# Patient Record
Sex: Male | Born: 1970 | Hispanic: No | Marital: Single | Smoking: Never smoker
Health system: Southern US, Community
[De-identification: ages and names within clinical notes are randomized; demographics above are authoritative.]

## PROBLEM LIST (undated history)

## (undated) DIAGNOSIS — R309 Painful micturition, unspecified: Secondary | ICD-10-CM

## (undated) DIAGNOSIS — N1330 Unspecified hydronephrosis: Secondary | ICD-10-CM

## (undated) DIAGNOSIS — A159 Respiratory tuberculosis unspecified: Secondary | ICD-10-CM

## (undated) HISTORY — PX: NO PAST SURGERIES: SHX2092

---

## 2000-11-14 ENCOUNTER — Emergency Department (HOSPITAL_COMMUNITY): Admission: EM | Admit: 2000-11-14 | Discharge: 2000-11-14 | Payer: Self-pay

## 2012-05-20 NOTE — Progress Notes (Signed)
Unable to reach pt for pre-op phone call, no working numbers in the system.

## 2012-05-21 ENCOUNTER — Ambulatory Visit (HOSPITAL_COMMUNITY)
Admission: RE | Admit: 2012-05-21 | Discharge: 2012-05-21 | Disposition: A | Discharge status: Home / Self Care | Payer: Self-pay | Source: Ambulatory Visit | Attending: Orthopedic Surgery | Admitting: Orthopedic Surgery

## 2012-05-21 ENCOUNTER — Encounter (HOSPITAL_COMMUNITY): Payer: Self-pay | Admitting: Anesthesiology

## 2012-05-21 ENCOUNTER — Encounter (HOSPITAL_COMMUNITY)
Admission: RE | Disposition: A | Discharge status: Home / Self Care | Payer: Self-pay | Source: Ambulatory Visit | Attending: Orthopedic Surgery

## 2012-05-21 ENCOUNTER — Ambulatory Visit (HOSPITAL_COMMUNITY): Payer: Self-pay

## 2012-05-21 ENCOUNTER — Encounter (HOSPITAL_COMMUNITY): Payer: Self-pay | Admitting: *Deleted

## 2012-05-21 ENCOUNTER — Ambulatory Visit (HOSPITAL_COMMUNITY): Payer: Self-pay | Admitting: Anesthesiology

## 2012-05-21 DIAGNOSIS — W268XXA Contact with other sharp object(s), not elsewhere classified, initial encounter: Secondary | ICD-10-CM | POA: Insufficient documentation

## 2012-05-21 DIAGNOSIS — S51811A Laceration without foreign body of right forearm, initial encounter: Secondary | ICD-10-CM

## 2012-05-21 DIAGNOSIS — S51809A Unspecified open wound of unspecified forearm, initial encounter: Secondary | ICD-10-CM | POA: Insufficient documentation

## 2012-05-21 HISTORY — PX: I & D EXTREMITY: SHX5045

## 2012-05-21 HISTORY — DX: Respiratory tuberculosis unspecified: A15.9

## 2012-05-21 LAB — SURGICAL PCR SCREEN
MRSA, PCR: NEGATIVE
Staphylococcus aureus: NEGATIVE

## 2012-05-21 LAB — CBC
MCH: 31.9 pg (ref 26.0–34.0)
MCHC: 35.1 g/dL (ref 30.0–36.0)
Platelets: 274 10*3/uL (ref 150–400)
RDW: 13.1 % (ref 11.5–15.5)

## 2012-05-21 LAB — BASIC METABOLIC PANEL
Calcium: 9.7 mg/dL (ref 8.4–10.5)
GFR calc Af Amer: >90 mL/min (ref >90)
GFR calc non Af Amer: >90 mL/min (ref >90)
Glucose, Bld: 89 mg/dL (ref 70–99)
Potassium: 3.8 mEq/L (ref 3.5–5.1)
Sodium: 140 mEq/L (ref 135–145)

## 2012-05-21 SURGERY — IRRIGATION AND DEBRIDEMENT EXTREMITY
Anesthesia: General | Site: Arm Lower | Laterality: Right | Wound class: Clean Contaminated

## 2012-05-21 MED ORDER — BUPIVACAINE HCL (PF) 0.25 % IJ SOLN
INTRAMUSCULAR | Status: DC | PRN
  Administered 2012-05-21: 10 mL

## 2012-05-21 MED ORDER — CEFAZOLIN SODIUM-DEXTROSE 2-3 GM-% IV SOLR
INTRAVENOUS | Status: AC
  Filled 2012-05-21: qty 50

## 2012-05-21 MED ORDER — HYDROMORPHONE HCL PF 1 MG/ML IJ SOLN
0.2500 mg | INTRAMUSCULAR | Status: DC | PRN
  Administered 2012-05-21 (×2): 0.5 mg via INTRAVENOUS

## 2012-05-21 MED ORDER — CHLORHEXIDINE GLUCONATE 4 % EX LIQD: 60.0000 mL | Freq: Once | CUTANEOUS | Status: DC

## 2012-05-21 MED ORDER — MUPIROCIN 2 % EX OINT
TOPICAL_OINTMENT | Freq: Once | CUTANEOUS | Status: AC
  Administered 2012-05-21: 1 via NASAL

## 2012-05-21 MED ORDER — PROPOFOL 10 MG/ML IV BOLUS
INTRAVENOUS | Status: DC | PRN
  Administered 2012-05-21: 200 mg via INTRAVENOUS

## 2012-05-21 MED ORDER — LIDOCAINE HCL (CARDIAC) 10 MG/ML IV SOLN
INTRAVENOUS | Status: DC | PRN
  Administered 2012-05-21: 80 mg via INTRAVENOUS

## 2012-05-21 MED ORDER — MIDAZOLAM HCL 5 MG/5ML IJ SOLN
INTRAMUSCULAR | Status: DC | PRN
  Administered 2012-05-21: 2 mg via INTRAVENOUS

## 2012-05-21 MED ORDER — LACTATED RINGERS IV SOLN
INTRAVENOUS | Status: DC
  Administered 2012-05-21: 15:00:00 via INTRAVENOUS

## 2012-05-21 MED ORDER — MUPIROCIN 2 % EX OINT
TOPICAL_OINTMENT | CUTANEOUS | Status: AC
  Filled 2012-05-21: qty 22

## 2012-05-21 MED ORDER — CEFAZOLIN SODIUM-DEXTROSE 2-3 GM-% IV SOLR
INTRAVENOUS | Status: DC | PRN
  Administered 2012-05-21: 2 g via INTRAVENOUS

## 2012-05-21 MED ORDER — FENTANYL CITRATE 0.05 MG/ML IJ SOLN
INTRAMUSCULAR | Status: DC | PRN
  Administered 2012-05-21 (×2): 100 ug via INTRAVENOUS

## 2012-05-21 MED ORDER — SODIUM CHLORIDE 0.9 % IR SOLN
Status: DC | PRN
  Administered 2012-05-21: 1000 mL

## 2012-05-21 MED ORDER — BUPIVACAINE HCL (PF) 0.25 % IJ SOLN
INTRAMUSCULAR | Status: AC
  Filled 2012-05-21: qty 30

## 2012-05-21 MED ORDER — ONDANSETRON HCL 4 MG/2ML IJ SOLN: 4.0000 mg | Freq: Once | INTRAMUSCULAR | Status: DC | PRN

## 2012-05-21 SURGICAL SUPPLY — 45 items
BANDAGE CONFORM 2  STR LF (GAUZE/BANDAGES/DRESSINGS) IMPLANT
BANDAGE ELASTIC 4 VELCRO ST LF (GAUZE/BANDAGES/DRESSINGS) ×2 IMPLANT
BANDAGE GAUZE ELAST BULKY 4 IN (GAUZE/BANDAGES/DRESSINGS) ×2 IMPLANT
CLOTH BEACON ORANGE TIMEOUT ST (SAFETY) ×2 IMPLANT
CORDS BIPOLAR (ELECTRODE) ×2 IMPLANT
CUFF TOURNIQUET SINGLE 18IN (TOURNIQUET CUFF) ×2 IMPLANT
CUFF TOURNIQUET SINGLE 24IN (TOURNIQUET CUFF) IMPLANT
CUFF TOURNIQUET SINGLE 34IN LL (TOURNIQUET CUFF) IMPLANT
CUFF TOURNIQUET SINGLE 44IN (TOURNIQUET CUFF) IMPLANT
DRSG ADAPTIC 3X8 NADH LF (GAUZE/BANDAGES/DRESSINGS) IMPLANT
ELECT REM PT RETURN 9FT ADLT (ELECTROSURGICAL) ×2
ELECTRODE REM PT RTRN 9FT ADLT (ELECTROSURGICAL) ×1 IMPLANT
GAUZE XEROFORM 1X8 LF (GAUZE/BANDAGES/DRESSINGS) ×2 IMPLANT
GLOVE BIOGEL M STRL SZ7.5 (GLOVE) ×2 IMPLANT
GLOVE SS BIOGEL STRL SZ 8 (GLOVE) ×1 IMPLANT
GLOVE SUPERSENSE BIOGEL SZ 8 (GLOVE) ×1
GOWN PREVENTION PLUS XLARGE (GOWN DISPOSABLE) IMPLANT
GOWN STRL NON-REIN LRG LVL3 (GOWN DISPOSABLE) ×4 IMPLANT
GOWN STRL REIN XL XLG (GOWN DISPOSABLE) ×4 IMPLANT
HANDPIECE INTERPULSE COAX TIP (DISPOSABLE)
KIT BASIN OR (CUSTOM PROCEDURE TRAY) ×2 IMPLANT
KIT ROOM TURNOVER OR (KITS) ×2 IMPLANT
MANIFOLD NEPTUNE II (INSTRUMENTS) ×2 IMPLANT
NEEDLE HYPO 25GX1X1/2 BEV (NEEDLE) ×2 IMPLANT
NS IRRIG 1000ML POUR BTL (IV SOLUTION) ×2 IMPLANT
PACK ORTHO EXTREMITY (CUSTOM PROCEDURE TRAY) ×2 IMPLANT
PAD ARMBOARD 7.5X6 YLW CONV (MISCELLANEOUS) ×4 IMPLANT
PAD CAST 4YDX4 CTTN HI CHSV (CAST SUPPLIES) ×1 IMPLANT
PADDING CAST COTTON 4X4 STRL (CAST SUPPLIES) ×1
SET HNDPC FAN SPRY TIP SCT (DISPOSABLE) IMPLANT
SPLINT FIBERGLASS 3X35 (CAST SUPPLIES) ×2 IMPLANT
SPONGE GAUZE 4X4 12PLY (GAUZE/BANDAGES/DRESSINGS) ×2 IMPLANT
SPONGE LAP 18X18 X RAY DECT (DISPOSABLE) IMPLANT
SPONGE LAP 4X18 X RAY DECT (DISPOSABLE) ×2 IMPLANT
SUCTION FRAZIER TIP 10 FR DISP (SUCTIONS) ×2 IMPLANT
SUT FIBERWIRE 3-0 18 TAPR NDL (SUTURE) ×2
SUT PROLENE 4 0 PS 2 18 (SUTURE) ×2 IMPLANT
SUTURE FIBERWR 3-0 18 TAPR NDL (SUTURE) ×1 IMPLANT
SYR CONTROL 10ML LL (SYRINGE) ×2 IMPLANT
TOWEL OR 17X24 6PK STRL BLUE (TOWEL DISPOSABLE) ×2 IMPLANT
TOWEL OR 17X26 10 PK STRL BLUE (TOWEL DISPOSABLE) ×2 IMPLANT
TUBE ANAEROBIC SPECIMEN COL (MISCELLANEOUS) IMPLANT
TUBE CONNECTING 12X1/4 (SUCTIONS) ×2 IMPLANT
WATER STERILE IRR 1000ML POUR (IV SOLUTION) IMPLANT
YANKAUER SUCT BULB TIP NO VENT (SUCTIONS) ×2 IMPLANT

## 2012-05-21 NOTE — Progress Notes (Signed)
Feels numbness on right arm able to wiggle fingers good capillary refill fingers warm

## 2012-05-21 NOTE — Anesthesia Postprocedure Evaluation (Signed)
Anesthesia Post Note  Patient: Jordan Patrick  Procedure(s) Performed: Procedure(s) (LRB): IRRIGATION AND DEBRIDEMENT EXTREMITY (Right)  Anesthesia type: general  Patient location: PACU  Post pain: Pain level controlled  Post assessment: Patient's Cardiovascular Status Stable  Last Vitals:  Filed Vitals:   05/21/12 1915  BP: 116/77  Pulse: 68  Temp:   Resp: 22    Post vital signs: Reviewed and stable  Level of consciousness: sedated  Complications: No apparent anesthesia complications

## 2012-05-21 NOTE — Transfer of Care (Signed)
Immediate Anesthesia Transfer of Care Note  Patient: Jordan Patrick  Procedure(s) Performed: Procedure(s) (LRB) with comments: IRRIGATION AND DEBRIDEMENT EXTREMITY (Right) - with tendon repair  Patient Location: PACU  Anesthesia Type: General  Level of Consciousness: awake, alert , oriented and patient cooperative  Airway & Oxygen Therapy: Patient Spontanous Breathing  Post-op Assessment: Report given to PACU RN, Post -op Vital signs reviewed and stable and Patient moving all extremities  Post vital signs: Reviewed and stable  Complications: No apparent anesthesia complications

## 2012-05-21 NOTE — Anesthesia Preprocedure Evaluation (Addendum)
Anesthesia Evaluation  Patient identified by MRN, date of birth, ID band Patient awake    Reviewed: Allergy & Precautions, H&P , NPO status , Patient's Chart, lab work & pertinent test results  History of Anesthesia Complications Negative for: history of anesthetic complications  Airway       Dental   Pulmonary neg pulmonary ROS,          Cardiovascular negative cardio ROS      Neuro/Psych negative neurological ROS  negative psych ROS   GI/Hepatic negative GI ROS, Neg liver ROS,   Endo/Other  negative endocrine ROS  Renal/GU negative Renal ROS     Musculoskeletal negative musculoskeletal ROS (+)   Abdominal   Peds  Hematology negative hematology ROS (+)   Anesthesia Other Findings   Reproductive/Obstetrics negative OB ROS                           Anesthesia Physical Anesthesia Plan  ASA: I  Anesthesia Plan: General   Post-op Pain Management:    Induction: Intravenous  Airway Management Planned: LMA  Additional Equipment:   Intra-op Plan:   Post-operative Plan: Extubation in OR  Informed Consent: I have reviewed the patients History and Physical, chart, labs and discussed the procedure including the risks, benefits and alternatives for the proposed anesthesia with the patient or authorized representative who has indicated his/her understanding and acceptance.   Dental advisory given  Plan Discussed with: CRNA and Anesthesiologist  Anesthesia Plan Comments:         Anesthesia Quick Evaluation

## 2012-05-21 NOTE — Op Note (Signed)
Dict # [id number] Dominica Severin Md

## 2012-05-21 NOTE — H&P (Signed)
Jordan Patrick is an 41 y.o. male.   Chief Complaint: Glass laceration to the right forearm with tendon injury and difficulty moving. HPI: Patient presents with glass injury to the form of difficulty moving his fingers and acute pain in this region secondary to the injury.  He was seen in urgent care and tree(s) placed on antibiotics and other measures I've asked to see and treat him for his tendon injuries. I discussed with him the issues risk and benefits etc.  ..Patient presents for evaluation and treatment of the of their upper extremity predicament. The patient denies neck back chest or of abdominal pain. The patient notes that they have no lower extremity problems. The patient from primarily complains of the upper extremity pain noted.  No past medical history on file.  No past surgical history on file.  No family history on file. Social History:  does not have a smoking history on file. He does not have any smokeless tobacco history on file. His alcohol and drug histories not on file.  Allergies: Allergies not on file  No prescriptions prior to admission    No results found for this or any previous visit (from the past 48 hour(s)). No results found.  Review of Systems  Constitutional: Negative.   Eyes: Negative.   Respiratory: Negative.   Cardiovascular: Negative.   Gastrointestinal: Negative.   Genitourinary: Negative.   Neurological: Negative.     There were no vitals taken for this visit. Physical Exam patient presents with laceration to the volar forearm he has loss of function in the fingers and pain. I cannot rule out median nerve injury however he has some degree of light touch sensation throughout the fingers. Given the location of the laceration certainly a median nerve injury is worrisome. The a patient has limited range of motion to his fingers. There is mild erythema around the arm.  He denies other complaints today.  Marland Kitchen.The patient is alert and oriented in no  acute distress the patient complains of pain in the affected upper extremity. The patient is noted to have a normal HEENT exam. Lung fields show equal chest expansion and no shortness of breath abdomen exam is nontender without distention. Lower extremity examination does not show any fracture dislocation or blood clot symptoms. Pelvis is stable neck and back are stable and nontender  Assessment/Plan .Marland KitchenWe are planning surgery for your upper extremity. The risk and benefits of surgery include risk of bleeding infection anesthesia damage to normal structures and failure of the surgery to accomplish its intended goals of relieving symptoms and restoring function with this in mind we'll going to proceed. I have specifically discussed with the patient the pre-and postoperative regime and the does and don'ts and risk and benefits in great detail. Risk and benefits of surgery also include risk of dystrophy chronic nerve pain failure of the healing process to go onto completion and other inherent risks of surgery The relavent the pathophysiology of the disease/injury process, as well as the alternatives for treatment and postoperative course of action has been discussed in great detail with the patient who desires to proceed.  We will do everything in our power to help you (the patient) restore function to the upper extremity. Is a pleasure to see this patient today.    Karen Chafe 05/21/2012, 6:24 AM

## 2012-05-21 NOTE — Progress Notes (Signed)
Able to move fingers still feels  numbness

## 2012-05-22 NOTE — Op Note (Signed)
NAMEElonda Patrick               ACCOUNT NO.:  [id number]  MEDICAL RECORD NO.:  [id number]  LOCATION:  [facility]                         FACILITY:  [facility]  PHYSICIAN:  Dionne Ano. Dyshon Philbin, M.D.DATE OF BIRTH:  May 18, 1971  DATE OF PROCEDURE: DATE OF DISCHARGE:  05/21/2012                              OPERATIVE REPORT   PREOPERATIVE DIAGNOSIS:  Laceration, right volar forearm with tendon injury.  POSTOPERATIVE DIAGNOSIS:  Flexor carpi radialis tendon laceration, right volar forearm with muscle belly injury to the flexor digitorum superficialis muscle.  SURGICAL PROCEDURE PERFORMED: 1. Irrigation and debridement of skin, subcutaneous tissue, tendon,     and muscle.  This was an excisional debridement. 2. Repair of flexor carpi radialis at the forearm level.  SURGEON:  Dionne Ano. Amanda Pea, M.D.  ASSISTANT:  None.  COMPLICATIONS:  None.  ANESTHESIA:  General.  TOURNIQUET TIME:  Less than 0.5 hour.  INDICATIONS FOR THE PROCEDURE:  Pleasant male presents with excruciating pain after a laceration.  He notes no locking, popping, catching, but states he is having difficulty moving the fingers secondary to pain.  I was asked to see and treat him.  OPERATION DETAILS:  The patient was seen by myself and anesthesia, taken to the operating suite, time-out was called, pre and postop checklist was completed, general anesthesia was induced.  He was laid supine and properly padded, prepped and draped in usual sterile fashion with Betadine scrub and paint about the right upper extremity.  Following this, the patient underwent removal of prior tacking sutures, placed in the urgent care, I then sized a 1 mm skin edge, dissected down and performed exploration.  He had FDS muscle belly injury, FCR tendon injury and notable findings of no obvious foreign body.  I irrigated with greater than a liter of saline and performed I and D of skin, subcutaneous tissue, muscle, and tendon.  This was an  excisional debridement.  Following this, I repaired the FCR tendon with FiberWire suture.  He tolerated this well.  Once this was done, I irrigated it copiously, deflated the tourniquet, and closed the wound with Prolene.  I did not see any deep FDS or FDP tendon laceration and I did not see any median nerve insult.  He tolerated the procedure well, was placed in a splint and awakened.  He was taken to the recovery room.  He will be monitored and discharged home after period of observation.  I will see him back in 12 days for suture removal.  I am going to encourage him to elevate, move fingers and call me if any problems.     Dionne Ano. Amanda Pea, M.D.     [facility]  D:  05/21/2012  T:  05/22/2012  Job:  [id number]

## 2012-05-25 ENCOUNTER — Encounter (HOSPITAL_COMMUNITY): Payer: Self-pay | Admitting: Orthopedic Surgery

## 2020-07-17 ENCOUNTER — Emergency Department (HOSPITAL_COMMUNITY)
Admission: EM | Admit: 2020-07-17 | Discharge: 2020-07-17 | Disposition: A | Discharge status: Home / Self Care | Payer: No Typology Code available for payment source | Attending: Emergency Medicine | Admitting: Emergency Medicine

## 2020-07-17 ENCOUNTER — Emergency Department (HOSPITAL_COMMUNITY): Payer: No Typology Code available for payment source

## 2020-07-17 ENCOUNTER — Other Ambulatory Visit: Payer: Self-pay

## 2020-07-17 ENCOUNTER — Encounter (HOSPITAL_COMMUNITY): Payer: Self-pay

## 2020-07-17 DIAGNOSIS — Y999 Unspecified external cause status: Secondary | ICD-10-CM | POA: Insufficient documentation

## 2020-07-17 DIAGNOSIS — Y92410 Unspecified street and highway as the place of occurrence of the external cause: Secondary | ICD-10-CM | POA: Insufficient documentation

## 2020-07-17 DIAGNOSIS — S299XXA Unspecified injury of thorax, initial encounter: Secondary | ICD-10-CM | POA: Diagnosis present

## 2020-07-17 DIAGNOSIS — M25561 Pain in right knee: Secondary | ICD-10-CM | POA: Insufficient documentation

## 2020-07-17 DIAGNOSIS — S20219A Contusion of unspecified front wall of thorax, initial encounter: Secondary | ICD-10-CM | POA: Diagnosis not present

## 2020-07-17 DIAGNOSIS — Y9389 Activity, other specified: Secondary | ICD-10-CM | POA: Diagnosis not present

## 2020-07-17 MED ORDER — IBUPROFEN 200 MG PO TABS
600.0000 mg | ORAL_TABLET | Freq: Once | ORAL | Status: AC
  Administered 2020-07-17: 600 mg via ORAL
  Filled 2020-07-17: qty 3

## 2020-07-17 MED ORDER — NAPROXEN 500 MG PO TABS: 500.0000 mg | ORAL_TABLET | Freq: Two times a day (BID) | ORAL | 0 refills | Status: DC

## 2020-07-17 MED ORDER — METHOCARBAMOL 500 MG PO TABS: 1000.0000 mg | ORAL_TABLET | Freq: Three times a day (TID) | ORAL | 0 refills | Status: DC

## 2020-07-17 NOTE — ED Notes (Signed)
Explained the process of Incentive Spirometry. Pt would not try due to pain when attempting to follow instructions. S/O present, she is aware to encourage him to use the IS 10 times an hour while awake. Verbalizes understanding.
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
## 2021-03-09 ENCOUNTER — Encounter (HOSPITAL_COMMUNITY): Payer: Self-pay | Admitting: *Deleted

## 2021-03-09 ENCOUNTER — Emergency Department (HOSPITAL_COMMUNITY)
Admission: EM | Admit: 2021-03-09 | Discharge: 2021-03-10 | Disposition: A | Discharge status: Home / Self Care | Payer: Self-pay | Attending: Emergency Medicine | Admitting: Emergency Medicine

## 2021-03-09 ENCOUNTER — Emergency Department (HOSPITAL_COMMUNITY): Payer: Self-pay

## 2021-03-09 ENCOUNTER — Other Ambulatory Visit: Payer: Self-pay

## 2021-03-09 DIAGNOSIS — S62612A Displaced fracture of proximal phalanx of right middle finger, initial encounter for closed fracture: Secondary | ICD-10-CM | POA: Insufficient documentation

## 2021-03-09 DIAGNOSIS — W19XXXA Unspecified fall, initial encounter: Secondary | ICD-10-CM | POA: Insufficient documentation

## 2021-03-09 DIAGNOSIS — Y92009 Unspecified place in unspecified non-institutional (private) residence as the place of occurrence of the external cause: Secondary | ICD-10-CM | POA: Insufficient documentation

## 2021-03-09 NOTE — ED Triage Notes (Signed)
Painful rt middle finger he was working with his hoarse and caught his finger in something.  Pain swollen

## 2021-03-09 NOTE — ED Provider Notes (Signed)
Emergency Medicine Provider Triage Evaluation Note  Jordan Patrick , a 50 y.o. male  was evaluated in triage.  Pt complains of right third finger pain.  Pt fell off of a horse today and landed on the finger. Obvious deformity with bruising pain and swelling. Difficulty with ROM due to pain. Denies numbness.   Physical Exam  BP 112/85 (BP Location: Right Arm)   Pulse 86   Temp 98.4 F (36.9 C) (Oral)   Resp 18   SpO2 98%  Gen:   Awake, no distress   Resp:  Normal effort  MSK:   Moves extremities without difficulty; right 3rd finger angulated at the MCP with bruising and swelling at the MCP. Good cap refill. Distal sensation intact. Unable to assess ROM due to patient's injury.  Other:    Medical Decision Making  Medically screening exam initiated at 10:01 PM.  Appropriate orders placed.  Stacey Maura was informed that the remainder of the evaluation will be completed by another provider, this initial triage assessment does not replace that evaluation, and the importance of remaining in the ED until their evaluation is complete.     Placido Sou, PA-C 03/09/21 2203    Charlynne Pander, MD 03/10/21 [phone]

## 2021-03-10 MED ORDER — OXYCODONE-ACETAMINOPHEN 5-325 MG PO TABS: 1.0000 | ORAL_TABLET | ORAL | 0 refills | Status: DC | PRN

## 2021-03-10 MED ORDER — OXYCODONE-ACETAMINOPHEN 5-325 MG PO TABS: 2.0000 | ORAL_TABLET | Freq: Once | ORAL | Status: DC

## 2021-03-10 MED ORDER — OXYCODONE-ACETAMINOPHEN 5-325 MG PO TABS
1.0000 | ORAL_TABLET | Freq: Once | ORAL | Status: AC
  Administered 2021-03-10: 1 via ORAL
  Filled 2021-03-10: qty 1

## 2021-03-10 NOTE — ED Provider Notes (Signed)
[facility] EMERGENCY DEPARTMENT Provider Note   CSN: [id number] Arrival date & time: 03/09/21  2133     History  CC:  hand injury   Jordan Patrick is a 50 y.o. male.  The history is provided by the patient and medical records.    50 y.o. M here with right middle finger injury.  Patient reports he fell onto right hand with fingers palmar flexed.  He states he manipulated it after the fall because it did not look right.  He reports ongoing pain to the finger and into dorsal hand.  Denies numbness/tingling.  He is left hand dominant.  No meds PTA.  Past Medical History:  Diagnosis Date   Tuberculosis    when he was 50 years old he was treated for TB and was in the hospital for 4 months    There are no problems to display for this patient.   Past Surgical History:  Procedure Laterality Date   I & D EXTREMITY  05/21/2012   Procedure: IRRIGATION AND DEBRIDEMENT EXTREMITY;  Surgeon: Dominica Severin, MD;  Location: [facility] OR;  Service: Orthopedics;  Laterality: Right;  with tendon repair   NO PAST SURGERIES         No family history on file.  Social History   Tobacco Use   Smoking status: Never   Smokeless tobacco: Never  Substance Use Topics   Alcohol use: Yes    Comment: occasional alcohol   Drug use: No    Home Medications Prior to Admission medications   Medication Sig Start Date End Date Taking? Authorizing Provider  cephALEXin (KEFLEX) 250 MG capsule Take 500 mg by mouth 3 (three) times daily. For 10 days. Filled on 05/19/12 should finish 05/29/12    [provider]  HYDROcodone-acetaminophen (VICODIN) 5-500 MG per tablet Take 1 tablet by mouth every 6 (six) hours as needed. For pain    [provider]  methocarbamol (ROBAXIN) 500 MG tablet Take 2 tablets (1,000 mg total) by mouth 3 (three) times daily. 07/17/20   Renne Crigler, PA-C  naproxen (NAPROSYN) 500 MG tablet Take 1 tablet (500 mg total) by mouth 2 (two) times daily.  07/17/20   Renne Crigler, PA-C    Allergies    Patient has no known allergies.  Review of Systems   Review of Systems  Musculoskeletal:  Positive for arthralgias.  All other systems reviewed and are negative.  Physical Exam Updated Vital Signs BP 107/87 (BP Location: Left Arm)   Pulse 72   Temp 98.4 F (36.9 C) (Oral)   Resp 17   Ht 5\' 5"  (1.651 m)   Wt 70.3 kg   SpO2 100%   BMI 25.79 kg/m   Physical Exam Vitals and nursing note reviewed.  Constitutional:      Appearance: He is well-developed.  HENT:     Head: Normocephalic and atraumatic.  Eyes:     Conjunctiva/sclera: Conjunctivae normal.     Pupils: Pupils are equal, round, and reactive to light.  Cardiovascular:     Rate and Rhythm: Normal rate and regular rhythm.     Heart sounds: Normal heart sounds.  Pulmonary:     Effort: Pulmonary effort is normal.     Breath sounds: Normal breath sounds.  Abdominal:     General: Bowel sounds are normal.     Palpations: Abdomen is soft.  Musculoskeletal:        General: Normal range of motion.     Cervical back: Normal  range of motion.     Comments: Left middle finger with deformity noted of proximal phalanx; there is swelling and bruising along 3rd digit and extending to the dorsal right hand; radial pulse intact, normal cap refill and sensation to all fingers, normal distal sensation  Skin:    General: Skin is warm and dry.  Neurological:     Mental Status: He is alert and oriented to person, place, and time.    ED Results / Procedures / Treatments   Labs (all labs ordered are listed, but only abnormal results are displayed) Labs Reviewed - No data to display  EKG None  Radiology DG Hand Complete Right  Result Date: 03/09/2021 CLINICAL DATA:  Right third digit pain and deformity after fall. EXAM: RIGHT HAND - COMPLETE 3+ VIEW COMPARISON:  None. FINDINGS: Acute fracture of the third digit proximal phalanx involves the proximal metaphysis. There is mild apex  ulnar angulation. Fracture does extend to the metacarpal phalangeal joint. No fracture of the remainder of the hand. Remaining alignment and joint spaces are normal. Soft tissue edema is noted at the fracture site. IMPRESSION: Acute mildly angulated third digit proximal phalanx fracture extending to the metacarpophalangeal joint. Electronically Signed   By: Narda Rutherford M.D.   On: 03/09/2021 22:38    Procedures Procedures   Medications Ordered in ED Medications  oxyCODONE-acetaminophen (PERCOCET/ROXICET) 5-325 MG per tablet 2 tablet (has no administration in time range)    ED Course  I have reviewed the triage vital signs and the nursing notes.  Pertinent labs & imaging results that were available during my care of the patient were reviewed by me and considered in my medical decision making (see chart for details).    MDM Rules/Calculators/A&P  50 year old male presenting to the ED with right hand pain.  He fell at home on right hand with fingers palm are flexed.  When standing up he noticed deformity of the middle finger and try to manipulate this himself.  Presents here due to persistent pain.  He does have obvious deformity of the proximal aspect of the right middle finger.  There is swelling and bruising extending down into the dorsal hand.  He has good distal sensation and perfusion to all of the fingers.  X-ray does reveal acute, mildly angulated proximal phalanx fracture extending into the MCP joint.  Patient was advised of these results, will be placed in volar splint and referred to hand surgery for follow-up.  Rx percocet for pain control.  Return here for new concerns.  Final Clinical Impression(s) / ED Diagnoses Final diagnoses:  Closed displaced fracture of proximal phalanx of right middle finger, initial encounter    Rx / DC Orders ED Discharge Orders          Ordered    oxyCODONE-acetaminophen (PERCOCET) 5-325 MG tablet  Every 4 hours PRN        03/10/21 0534              Garlon Hatchet, PA-C 03/10/21 0537    Sabas Sous, MD 03/10/21 [phone]

## 2021-03-10 NOTE — Discharge Instructions (Addendum)
Take the prescribed medication as directed.  Can take motrin with this between doses, avoid taking extra tylenol. Follow-up with Dr. Izora Ribas-- call his office Monday (may be closed for holiday) or Tuesday to schedule appt. Return to the ED for new or worsening symptoms.

## 2021-03-10 NOTE — Progress Notes (Signed)
Orthopedic Tech Progress Note Patient Details:  Jordan Patrick 01/31/1971 [id number]  Ortho Devices Type of Ortho Device: Volar splint Ortho Device/Splint Location: RUE Ortho Device/Splint Interventions: Ordered, Application, Adjustment   Post Interventions Patient Tolerated: Well Instructions Provided: Care of device, Poper ambulation with device  [name] 03/10/2021, 6:14 AM

## 2021-06-06 ENCOUNTER — Emergency Department (HOSPITAL_COMMUNITY)
Admission: EM | Admit: 2021-06-06 | Discharge: 2021-06-07 | Disposition: A | Discharge status: Home / Self Care | Payer: Self-pay | Attending: Emergency Medicine | Admitting: Emergency Medicine

## 2021-06-06 ENCOUNTER — Emergency Department (HOSPITAL_COMMUNITY): Payer: Self-pay

## 2021-06-06 ENCOUNTER — Other Ambulatory Visit: Payer: Self-pay

## 2021-06-06 ENCOUNTER — Encounter (HOSPITAL_COMMUNITY): Payer: Self-pay | Admitting: Emergency Medicine

## 2021-06-06 DIAGNOSIS — J039 Acute tonsillitis, unspecified: Secondary | ICD-10-CM | POA: Insufficient documentation

## 2021-06-06 DIAGNOSIS — J029 Acute pharyngitis, unspecified: Secondary | ICD-10-CM

## 2021-06-06 DIAGNOSIS — L539 Erythematous condition, unspecified: Secondary | ICD-10-CM | POA: Insufficient documentation

## 2021-06-06 LAB — CBC WITH DIFFERENTIAL/PLATELET
Abs Immature Granulocytes: 0.22 10*3/uL — ABNORMAL HIGH (ref 0.00–0.07)
Basophils Absolute: 0.1 10*3/uL (ref 0.0–0.1)
Basophils Relative: 0 %
Eosinophils Absolute: 0 10*3/uL (ref 0.0–0.5)
Eosinophils Relative: 0 %
HCT: 44.9 % (ref 39.0–52.0)
Hemoglobin: 15.6 g/dL (ref 13.0–17.0)
Immature Granulocytes: 1 %
Lymphocytes Relative: 6 %
Lymphs Abs: 1.7 10*3/uL (ref 0.7–4.0)
MCH: 32.4 pg (ref 26.0–34.0)
MCHC: 34.7 g/dL (ref 30.0–36.0)
MCV: 93.2 fL (ref 80.0–100.0)
Monocytes Absolute: 1.6 10*3/uL — ABNORMAL HIGH (ref 0.1–1.0)
Monocytes Relative: 6 %
Neutro Abs: 23.7 10*3/uL — ABNORMAL HIGH (ref 1.7–7.7)
Neutrophils Relative %: 87 %
Platelets: 277 10*3/uL (ref 150–400)
RBC: 4.82 MIL/uL (ref 4.22–5.81)
RDW: 13.3 % (ref 11.5–15.5)
WBC: 27.3 10*3/uL — ABNORMAL HIGH (ref 4.0–10.5)
nRBC: 0 % (ref 0.0–0.2)

## 2021-06-06 LAB — GROUP A STREP BY PCR: Group A Strep by PCR: NOT DETECTED

## 2021-06-06 LAB — BASIC METABOLIC PANEL
Anion gap: 11 (ref 5–15)
BUN: 7 mg/dL (ref 6–20)
CO2: 22 mmol/L (ref 22–32)
Calcium: 8.9 mg/dL (ref 8.9–10.3)
Chloride: 102 mmol/L (ref 98–111)
Creatinine, Ser: 1.02 mg/dL (ref 0.61–1.24)
GFR, Estimated: >60 mL/min (ref >60)
Glucose, Bld: 103 mg/dL — ABNORMAL HIGH (ref 70–99)
Potassium: 3.3 mmol/L — ABNORMAL LOW (ref 3.5–5.1)
Sodium: 135 mmol/L (ref 135–145)

## 2021-06-06 MED ORDER — DEXAMETHASONE SODIUM PHOSPHATE 4 MG/ML IJ SOLN: 4.0000 mg | Freq: Once | INTRAMUSCULAR | Status: DC

## 2021-06-06 MED ORDER — DEXAMETHASONE SODIUM PHOSPHATE 4 MG/ML IJ SOLN
4.0000 mg | Freq: Once | INTRAMUSCULAR | Status: AC
  Administered 2021-06-06: 4 mg via INTRAVENOUS
  Filled 2021-06-06: qty 1

## 2021-06-06 MED ORDER — KETOROLAC TROMETHAMINE 60 MG/2ML IM SOLN: 60.0000 mg | Freq: Once | INTRAMUSCULAR | Status: DC

## 2021-06-06 MED ORDER — KETOROLAC TROMETHAMINE 30 MG/ML IJ SOLN
30.0000 mg | Freq: Once | INTRAMUSCULAR | Status: AC
  Administered 2021-06-06: 30 mg via INTRAVENOUS
  Filled 2021-06-06: qty 1

## 2021-06-06 MED ORDER — ACETAMINOPHEN 325 MG PO TABS: 650.0000 mg | ORAL_TABLET | Freq: Once | ORAL | Status: DC

## 2021-06-06 NOTE — ED Triage Notes (Signed)
Patient reports sore throat with swelling , hard to swallow and bilateral ear ache , fever , respirations unlabored.

## 2021-06-06 NOTE — ED Provider Notes (Signed)
Emergency Medicine Provider Triage Evaluation Note  Jordan Patrick , a 50 y.o. male  was evaluated in triage.  Pt complains of gradual onset, constant, achy, sore throat that began yesterday.  Patient reports he is unable to speak due to the amount of pain.  He states that he is not tolerating his own secretions.  His daughter is at bedside and is mostly speaking for him as he does not want to use his voice.  She also reports subjective fevers and bilateral ear pain.  He has been taking Tylenol at home without relief.  Review of Systems  Positive: + sore throat, ear pain, voice change, drooling Negative: - abdominal pain, nausea, vomiting  Physical Exam  There were no vitals taken for this visit. Gen:   Awake, no distress   Resp:  Normal effort  MSK:   Moves extremities without difficulty  Other:  2+ tonsillar hypertrophy to R tonsil, 1+ left tonsil. + exudates. Uvula with slight deviation to left side. Dry MM.   Medical Decision Making  Medically screening exam initiated at 9:41 PM.  Appropriate orders placed.  [name] was informed that the remainder of the evaluation will be completed by another provider, this initial triage assessment does not replace that evaluation, and the importance of remaining in the ED until their evaluation is complete.     Tanda Rockers, PA-C 06/06/21 2141    Bethann Berkshire, MD 06/06/21 [phone]

## 2021-06-07 MED ORDER — DEXAMETHASONE SODIUM PHOSPHATE 10 MG/ML IJ SOLN
16.0000 mg | Freq: Once | INTRAMUSCULAR | Status: AC
  Administered 2021-06-07: 16 mg via INTRAVENOUS
  Filled 2021-06-07: qty 2

## 2021-06-07 MED ORDER — DEXAMETHASONE SODIUM PHOSPHATE 10 MG/ML IJ SOLN: 6.0000 mg | Freq: Once | INTRAMUSCULAR | Status: DC

## 2021-06-07 MED ORDER — CLINDAMYCIN PHOSPHATE 600 MG/50ML IV SOLN
600.0000 mg | Freq: Once | INTRAVENOUS | Status: AC
  Administered 2021-06-07: 600 mg via INTRAVENOUS
  Filled 2021-06-07: qty 50

## 2021-06-07 MED ORDER — DEXAMETHASONE SODIUM PHOSPHATE 10 MG/ML IJ SOLN: 10.0000 mg | Freq: Once | INTRAMUSCULAR | Status: DC

## 2021-06-07 MED ORDER — LIDOCAINE VISCOUS HCL 2 % MT SOLN
15.0000 mL | Freq: Once | OROMUCOSAL | Status: AC
  Administered 2021-06-07: 15 mL via OROMUCOSAL
  Filled 2021-06-07: qty 15

## 2021-06-07 MED ORDER — CLINDAMYCIN HCL 300 MG PO CAPS: 300.0000 mg | ORAL_CAPSULE | Freq: Three times a day (TID) | ORAL | 0 refills | Status: AC

## 2021-06-07 MED ORDER — IOHEXOL 350 MG/ML SOLN
75.0000 mL | Freq: Once | INTRAVENOUS | Status: AC | PRN
  Administered 2021-06-07: 75 mL via INTRAVENOUS

## 2021-06-07 NOTE — Discharge Instructions (Addendum)
You came to the emergency department today to be evaluated for your ear pain and sore throat.  Your ears showed no signs of infection.  Your tonsils were swollen, the CT scan of your neck showed no acute abscesses.  Due to your tonsillar swelling and sore throat you were given steroids in the emergency department to help decrease your swelling.  You are also started on the antibiotic clindamycin.  You will need to take this prescription over the next 10 days.  I have given you information to follow-up with an ear nose and throat doctor.  If your sore throat does not get better please follow-up with the ear nose and throat doctor.  Get help right away if: You develop any new symptoms, such as vomiting, severe headache, stiff neck, chest pain, trouble breathing, or trouble swallowing. You have severe throat pain along with drooling or voice changes. You have severe pain that is not controlled with medicines. You cannot fully open your mouth. You develop redness, swelling, or severe pain anywhere in your neck. You may have diarrhea from the antibiotics.  It is very important that you continue to take the antibiotics even if you get diarrhea unless a medical professional tells you that you may stop taking them.  If you stop too early the bacteria you are being treated for will become stronger and you may need different, more powerful antibiotics that have more side effects and worsening diarrhea.  Please stay well hydrated and consider probiotics as they may decrease the severity of your diarrhea.   Today you received steroids.  Some common side effects include feelings of extra energy, feeling warm, increased appetite, and stomach upset.  If you are diabetic your sugars may run higher than usual.

## 2021-06-07 NOTE — ED Provider Notes (Signed)
[facility] EMERGENCY DEPARTMENT Provider Note   CSN: [id number] Arrival date & time: 06/06/21  2124     History Chief Complaint  Patient presents with   Sore Throat / Ear Ache    Jordan Patrick is a 50 y.o. male with reported past medical history of tuberculosis.  Patient presents to the emergency department with a chief complaint of sore throat and bilateral ear pain.  Patient reports that his symptoms started on Tuesday night.  Symptoms have been constant since then.  Symptoms have gotten progressively worse over time.  Patient rates his pain 10/10 on the pain scale.  Sore throat is worse with swallowing.  Patient has not tried any modalities to alleviate his symptoms.  Patient endorses trouble swallowing and drooling.  Denies any trismus, hot potato voice, neck pain, neck stiffness.  Patient reports pain to bilateral ears.  No relieving factors.  Patient has not tried any modalities to alleviate his symptoms.  Patient endorses subjective fevers and lymphadenopathy. Patient denies any rhinorrhea, nasal congestion, patient swelling, neck pain, neck stiffness, cough, shortness of breath, chills, hearing loss, ear discharge.  Patient denies any known sick contacts.  Patient reports similar episode of sore throat 1 year prior.  Spanish interpreter was used to conduct this interview.  HPI     Past Medical History:  Diagnosis Date   Tuberculosis    when he was 50 years old he was treated for TB and was in the hospital for 4 months    There are no problems to display for this patient.   Past Surgical History:  Procedure Laterality Date   I & D EXTREMITY  05/21/2012   Procedure: IRRIGATION AND DEBRIDEMENT EXTREMITY;  Surgeon: Dominica Severin, MD;  Location: [facility] OR;  Service: Orthopedics;  Laterality: Right;  with tendon repair   NO PAST SURGERIES         No family history on file.  Social History   Tobacco Use   Smoking status: Never   Smokeless  tobacco: Never  Substance Use Topics   Alcohol use: Yes    Comment: occasional alcohol   Drug use: No    Home Medications Prior to Admission medications   Medication Sig Start Date End Date Taking? Authorizing Provider  cephALEXin (KEFLEX) 250 MG capsule Take 500 mg by mouth 3 (three) times daily. For 10 days. Filled on 05/19/12 should finish 05/29/12    [provider]  HYDROcodone-acetaminophen (VICODIN) 5-500 MG per tablet Take 1 tablet by mouth every 6 (six) hours as needed. For pain    [provider]  methocarbamol (ROBAXIN) 500 MG tablet Take 2 tablets (1,000 mg total) by mouth 3 (three) times daily. 07/17/20   Renne Crigler, PA-C  naproxen (NAPROSYN) 500 MG tablet Take 1 tablet (500 mg total) by mouth 2 (two) times daily. 07/17/20   Renne Crigler, PA-C  oxyCODONE-acetaminophen (PERCOCET) 5-325 MG tablet Take 1 tablet by mouth every 4 (four) hours as needed. 03/10/21   Garlon Hatchet, PA-C    Allergies    Patient has no known allergies.  Review of Systems   Review of Systems  Constitutional:  Negative for chills and fever.  HENT:  Positive for drooling, ear pain, sore throat and trouble swallowing. Negative for congestion, ear discharge, facial swelling, hearing loss, mouth sores, rhinorrhea, sinus pressure, sinus pain, tinnitus and voice change.   Eyes:  Negative for visual disturbance.  Respiratory:  Negative for cough and shortness of breath.   Cardiovascular:  Negative for chest pain.  Gastrointestinal:  Negative for abdominal pain, nausea and vomiting.  Musculoskeletal:  Negative for back pain, neck pain and neck stiffness.  Skin:  Negative for color change and rash.  Neurological:  Negative for dizziness, syncope, light-headedness and headaches.  Psychiatric/Behavioral:  Negative for confusion.    Physical Exam Updated Vital Signs BP 118/89 (BP Location: Right Arm)   Pulse 97   Temp 98.2 F (36.8 C) (Oral)   Resp 16   SpO2 100%   Physical  Exam Vitals and nursing note reviewed.  Constitutional:      General: He is not in acute distress.    Appearance: He is not ill-appearing, toxic-appearing or diaphoretic.  HENT:     Head: Normocephalic.     Jaw: No trismus, tenderness, swelling, pain on movement or malocclusion.     Right Ear: Tympanic membrane, ear canal and external ear normal. No mastoid tenderness.     Left Ear: Tympanic membrane, ear canal and external ear normal. No mastoid tenderness.     Mouth/Throat:     Lips: Pink. No lesions.     Mouth: Mucous membranes are moist. No injury, lacerations, oral lesions or angioedema.     Tongue: No lesions. Tongue does not deviate from midline.     Palate: No mass and lesions.     Pharynx: Oropharynx is clear. Uvula midline. Posterior oropharyngeal erythema present. No pharyngeal swelling, oropharyngeal exudate or uvula swelling.     Tonsils: Tonsillar exudate present. No tonsillar abscesses. 3+ on the right. 2+ on the left.     Comments: Patient able to handle oral secretions without difficulty.  +3 right tonsil, +2 left tonsil.  Exudate noted to right tonsil.  No swelling to oropharynx or oral mucosa.  Uvula is midline. Eyes:     General: No scleral icterus.       Right eye: No discharge.        Left eye: No discharge.  Cardiovascular:     Rate and Rhythm: Normal rate.  Pulmonary:     Effort: Pulmonary effort is normal. No tachypnea, bradypnea or respiratory distress.     Breath sounds: Normal breath sounds. No stridor.     Comments: Speaks in full complete sentences without difficulty. Musculoskeletal:     Cervical back: Normal range of motion and neck supple. No edema, erythema, signs of trauma, rigidity, torticollis or crepitus. No pain with movement. Normal range of motion.  Lymphadenopathy:     Cervical: Cervical adenopathy present.     Right cervical: Superficial cervical adenopathy present.     Left cervical: Superficial cervical adenopathy present.  Skin:     General: Skin is warm and dry.  Neurological:     General: No focal deficit present.     Mental Status: He is alert.  Psychiatric:        Behavior: Behavior is cooperative.    ED Results / Procedures / Treatments   Labs (all labs ordered are listed, but only abnormal results are displayed) Labs Reviewed  BASIC METABOLIC PANEL - Abnormal; Notable for the following components:      Result Value   Potassium 3.3 (*)    Glucose, Bld 103 (*)    All other components within normal limits  CBC WITH DIFFERENTIAL/PLATELET - Abnormal; Notable for the following components:   WBC 27.3 (*)    Neutro Abs 23.7 (*)    Monocytes Absolute 1.6 (*)    Abs Immature Granulocytes 0.22 (*)    All  other components within normal limits  GROUP A STREP BY PCR    EKG None  Radiology CT Soft Tissue Neck W Contrast  Result Date: 06/07/2021 CLINICAL DATA:  Sore throat EXAM: CT NECK WITH CONTRAST TECHNIQUE: Multidetector CT imaging of the neck was performed using the standard protocol following the bolus administration of intravenous contrast. CONTRAST:  35mL OMNIPAQUE IOHEXOL 350 MG/ML SOLN COMPARISON:  None. FINDINGS: PHARYNX AND LARYNX: There is enlargement of the palatine tonsils, right-greater-than-left. No peritonsillar or retropharyngeal abscess. The epiglottis is normal. Mild enlargement of the lingual tonsils. No airway narrowing. SALIVARY GLANDS: Normal parotid, submandibular and sublingual glands. THYROID: Normal. LYMPH NODES: Bilateral reactive cervical lymphadenopathy. VASCULAR: Major cervical vessels are patent. LIMITED INTRACRANIAL: Normal. VISUALIZED ORBITS: Normal. MASTOIDS AND VISUALIZED PARANASAL SINUSES: No fluid levels or advanced mucosal thickening. No mastoid effusion. SKELETON: No bony spinal canal stenosis. No lytic or blastic lesions. UPPER CHEST: Clear. OTHER: None. IMPRESSION: 1. Acute tonsillopharyngitis without peritonsillar or retropharyngeal abscess. 2. Reactive cervical lymphadenopathy.  Electronically Signed   By: Deatra Robinson M.D.   On: 06/07/2021 00:31    Procedures Procedures   Medications Ordered in ED Medications  ketorolac (TORADOL) 30 MG/ML injection 30 mg (30 mg Intravenous Given 06/06/21 2155)  dexamethasone (DECADRON) injection 4 mg (4 mg Intravenous Given 06/06/21 2155)  iohexol (OMNIPAQUE) 350 MG/ML injection 75 mL (75 mLs Intravenous Contrast Given 06/07/21 0001)  lidocaine (XYLOCAINE) 2 % viscous mouth solution 15 mL (15 mLs Mouth/Throat Given 06/07/21 1309)  dexamethasone (DECADRON) injection 16 mg (16 mg Intravenous Given 06/07/21 1306)  clindamycin (CLEOCIN) IVPB 600 mg (0 mg Intravenous Stopped 06/07/21 1339)    ED Course  I have reviewed the triage vital signs and the nursing notes.  Pertinent labs & imaging results that were available during my care of the patient were reviewed by me and considered in my medical decision making (see chart for details).    MDM Rules/Calculators/A&P                           Alert 50 year old male no acute distress, nontoxic-appearing.  Presents to ED with chief complaint of bilateral ear pain and sore throat.  Patient reports symptoms started on Tuesday.  Patient endorses trouble swallowing and drooling.  Patient initially febrile at 100.4 on arrival.  Patient able to handle oral secretions without difficulty.  Patient speaks in focally sentences without difficulty.  No hot potato voice.  Neck is supple, full range of motion.  No swelling or edema to patient's neck.  +3 right tonsil with exudate, +2 left tonsil without exudate.  Uvula midline.  Lab work was obtained while patient was in triage.  Additionally patient received 4 mg dexamethasone as well as Toradol.  BMP shows potassium slightly decreased at 3.3.  CBC shows leukocytosis with count of 27.3.  Rapid Group A strep negative.  CT soft tissue neck shows tonsillopharyngitis without peritonsillar or retropharyngeal abscess.  Reactive cervical  lymphadenopathy.  Will reach out to ENT due to patient's marked and asymmetric tonsillar swelling.    Spoke to on-call ENT Dr. Suszanne Conners who advised to give patient a total of 20 mg dexamethasone as well as IV clindamycin.  Patient will need 10-day course of clindamycin in outpatient setting.  If patient symptoms do not improve he is to follow-up with ENT in outpatient setting.  After receiving dexamethasone and IV clindamycin patient was reassessed.  Patient continues to have no acute distress.  Able to speak  in focally sentences without difficulty.  Able to handle oral secretions.  Will discharge patient at this time.  Patient was discussed with and evaluated by Dr. Durwin Nora.  Discussed results, findings, treatment and follow up. Patient advised of return precautions. Patient verbalized understanding and agreed with plan.    Final Clinical Impression(s) / ED Diagnoses Final diagnoses:  Tonsillitis  Sore throat    Rx / DC Orders ED Discharge Orders          Ordered    clindamycin (CLEOCIN) 300 MG capsule  3 times daily        06/07/21 1357             Berneice Heinrich 06/07/21 1740    Gloris Manchester, MD 06/07/21 2004

## 2021-06-18 ENCOUNTER — Encounter (HOSPITAL_COMMUNITY): Payer: Self-pay | Admitting: Internal Medicine

## 2021-06-18 ENCOUNTER — Inpatient Hospital Stay (HOSPITAL_COMMUNITY): Payer: Self-pay | Admitting: Certified Registered"

## 2021-06-18 ENCOUNTER — Other Ambulatory Visit: Payer: Self-pay

## 2021-06-18 ENCOUNTER — Encounter (HOSPITAL_COMMUNITY)
Admission: EM | Disposition: A | Discharge status: Home / Self Care | Payer: Self-pay | Source: Home / Self Care | Attending: Internal Medicine

## 2021-06-18 ENCOUNTER — Inpatient Hospital Stay (HOSPITAL_COMMUNITY)
Admission: EM | Admit: 2021-06-18 | Discharge: 2021-06-20 | DRG: 854 | Disposition: A | Discharge status: Home / Self Care | Payer: Self-pay | Attending: Family Medicine | Admitting: Family Medicine

## 2021-06-18 ENCOUNTER — Inpatient Hospital Stay (HOSPITAL_COMMUNITY): Payer: Self-pay

## 2021-06-18 ENCOUNTER — Emergency Department (HOSPITAL_COMMUNITY): Payer: Self-pay

## 2021-06-18 DIAGNOSIS — Z20822 Contact with and (suspected) exposure to covid-19: Secondary | ICD-10-CM | POA: Diagnosis present

## 2021-06-18 DIAGNOSIS — N200 Calculus of kidney: Principal | ICD-10-CM | POA: Insufficient documentation

## 2021-06-18 DIAGNOSIS — N202 Calculus of kidney with calculus of ureter: Secondary | ICD-10-CM | POA: Diagnosis present

## 2021-06-18 DIAGNOSIS — Z79899 Other long term (current) drug therapy: Secondary | ICD-10-CM

## 2021-06-18 DIAGNOSIS — N136 Pyonephrosis: Secondary | ICD-10-CM | POA: Diagnosis present

## 2021-06-18 DIAGNOSIS — A419 Sepsis, unspecified organism: Principal | ICD-10-CM | POA: Diagnosis present

## 2021-06-18 DIAGNOSIS — N390 Urinary tract infection, site not specified: Secondary | ICD-10-CM

## 2021-06-18 DIAGNOSIS — N400 Enlarged prostate without lower urinary tract symptoms: Secondary | ICD-10-CM | POA: Diagnosis present

## 2021-06-18 HISTORY — PX: CYSTOSCOPY WITH RETROGRADE PYELOGRAM, URETEROSCOPY AND STENT PLACEMENT: SHX5789

## 2021-06-18 LAB — CBC WITH DIFFERENTIAL/PLATELET
Abs Immature Granulocytes: 0.12 10*3/uL — ABNORMAL HIGH (ref 0.00–0.07)
Basophils Absolute: 0.1 10*3/uL (ref 0.0–0.1)
Basophils Relative: 0 %
Eosinophils Absolute: 0 10*3/uL (ref 0.0–0.5)
Eosinophils Relative: 0 %
HCT: 39 % (ref 39.0–52.0)
Hemoglobin: 13.2 g/dL (ref 13.0–17.0)
Immature Granulocytes: 1 %
Lymphocytes Relative: 4 %
Lymphs Abs: 0.9 10*3/uL (ref 0.7–4.0)
MCH: 31.8 pg (ref 26.0–34.0)
MCHC: 33.8 g/dL (ref 30.0–36.0)
MCV: 94 fL (ref 80.0–100.0)
Monocytes Absolute: 0.7 10*3/uL (ref 0.1–1.0)
Monocytes Relative: 3 %
Neutro Abs: 19.7 10*3/uL — ABNORMAL HIGH (ref 1.7–7.7)
Neutrophils Relative %: 92 %
Platelets: 365 10*3/uL (ref 150–400)
RBC: 4.15 MIL/uL — ABNORMAL LOW (ref 4.22–5.81)
RDW: 13.1 % (ref 11.5–15.5)
WBC: 21.6 10*3/uL — ABNORMAL HIGH (ref 4.0–10.5)
nRBC: 0 % (ref 0.0–0.2)

## 2021-06-18 LAB — LACTIC ACID, PLASMA
Lactic Acid, Venous: 0.9 mmol/L (ref 0.5–1.9)
Lactic Acid, Venous: 0.9 mmol/L (ref 0.5–1.9)
Lactic Acid, Venous: 0.8 mmol/L (ref 0.5–1.9)

## 2021-06-18 LAB — COMPREHENSIVE METABOLIC PANEL
ALT: 29 U/L (ref 0–44)
AST: 20 U/L (ref 15–41)
Albumin: 3.5 g/dL (ref 3.5–5.0)
Alkaline Phosphatase: 50 U/L (ref 38–126)
Anion gap: 11 (ref 5–15)
BUN: 15 mg/dL (ref 6–20)
CO2: 21 mmol/L — ABNORMAL LOW (ref 22–32)
Calcium: 9 mg/dL (ref 8.9–10.3)
Chloride: 103 mmol/L (ref 98–111)
Creatinine, Ser: 1.13 mg/dL (ref 0.61–1.24)
GFR, Estimated: >60 mL/min (ref >60)
Glucose, Bld: 102 mg/dL — ABNORMAL HIGH (ref 70–99)
Potassium: 3.8 mmol/L (ref 3.5–5.1)
Sodium: 135 mmol/L (ref 135–145)
Total Bilirubin: 1.1 mg/dL (ref 0.3–1.2)
Total Protein: 7 g/dL (ref 6.5–8.1)

## 2021-06-18 LAB — URINALYSIS, ROUTINE W REFLEX MICROSCOPIC
Bilirubin Urine: NEGATIVE
Glucose, UA: NEGATIVE mg/dL
Ketones, ur: 5 mg/dL — AB
Nitrite: NEGATIVE
Protein, ur: 100 mg/dL — AB
RBC / HPF: >50 RBC/hpf — ABNORMAL HIGH (ref 0–5)
Specific Gravity, Urine: 1.027 (ref 1.005–1.030)
WBC, UA: >50 WBC/hpf — ABNORMAL HIGH (ref 0–5)
pH: 5 (ref 5.0–8.0)

## 2021-06-18 LAB — LIPASE, BLOOD: Lipase: 38 U/L (ref 11–51)

## 2021-06-18 LAB — RESP PANEL BY RT-PCR (FLU A&B, COVID) ARPGX2
Influenza A by PCR: NEGATIVE
Influenza B by PCR: NEGATIVE
SARS Coronavirus 2 by RT PCR: NEGATIVE

## 2021-06-18 LAB — HIV ANTIBODY (ROUTINE TESTING W REFLEX): HIV Screen 4th Generation wRfx: NONREACTIVE

## 2021-06-18 SURGERY — CYSTOURETEROSCOPY, WITH RETROGRADE PYELOGRAM AND STENT INSERTION
Anesthesia: General | Laterality: Right

## 2021-06-18 MED ORDER — ONDANSETRON HCL 4 MG/2ML IJ SOLN: 4.0000 mg | Freq: Four times a day (QID) | INTRAMUSCULAR | Status: DC | PRN

## 2021-06-18 MED ORDER — ORAL CARE MOUTH RINSE: 15.0000 mL | Freq: Once | OROMUCOSAL | Status: AC

## 2021-06-18 MED ORDER — FENTANYL CITRATE (PF) 250 MCG/5ML IJ SOLN
INTRAMUSCULAR | Status: AC
  Filled 2021-06-18: qty 5

## 2021-06-18 MED ORDER — SODIUM CHLORIDE 0.9 % IV SOLN: INTRAVENOUS | Status: DC

## 2021-06-18 MED ORDER — SODIUM CHLORIDE 0.9 % IR SOLN
Status: DC | PRN
  Administered 2021-06-18: 3000 mL via INTRAVESICAL

## 2021-06-18 MED ORDER — TAMSULOSIN HCL 0.4 MG PO CAPS
0.4000 mg | ORAL_CAPSULE | Freq: Every day | ORAL | Status: DC
  Administered 2021-06-18 – 2021-06-20 (×3): 0.4 mg via ORAL
  Filled 2021-06-18 (×3): qty 1

## 2021-06-18 MED ORDER — OXYCODONE-ACETAMINOPHEN 5-325 MG PO TABS: 1.0000 | ORAL_TABLET | Freq: Once | ORAL | Status: DC

## 2021-06-18 MED ORDER — ONDANSETRON HCL 4 MG/2ML IJ SOLN
INTRAMUSCULAR | Status: AC
  Filled 2021-06-18: qty 2

## 2021-06-18 MED ORDER — FENTANYL CITRATE (PF) 100 MCG/2ML IJ SOLN: 25.0000 ug | INTRAMUSCULAR | Status: DC | PRN

## 2021-06-18 MED ORDER — KETOROLAC TROMETHAMINE 30 MG/ML IJ SOLN
30.0000 mg | Freq: Once | INTRAMUSCULAR | Status: AC
  Administered 2021-06-18: 30 mg via INTRAVENOUS
  Filled 2021-06-18: qty 1

## 2021-06-18 MED ORDER — ACETAMINOPHEN 650 MG RE SUPP: 650.0000 mg | Freq: Four times a day (QID) | RECTAL | Status: DC | PRN

## 2021-06-18 MED ORDER — PROPOFOL 10 MG/ML IV BOLUS
INTRAVENOUS | Status: AC
  Filled 2021-06-18: qty 20

## 2021-06-18 MED ORDER — CHLORHEXIDINE GLUCONATE 0.12 % MT SOLN
15.0000 mL | Freq: Once | OROMUCOSAL | Status: AC
  Administered 2021-06-18: 15 mL via OROMUCOSAL
  Filled 2021-06-18: qty 15

## 2021-06-18 MED ORDER — HYDROMORPHONE HCL 1 MG/ML IJ SOLN: 0.5000 mg | INTRAMUSCULAR | Status: DC | PRN

## 2021-06-18 MED ORDER — MORPHINE SULFATE (PF) 4 MG/ML IV SOLN
4.0000 mg | Freq: Once | INTRAVENOUS | Status: AC
  Administered 2021-06-18: 4 mg via INTRAVENOUS
  Filled 2021-06-18: qty 1

## 2021-06-18 MED ORDER — ONDANSETRON HCL 4 MG PO TABS: 4.0000 mg | ORAL_TABLET | Freq: Four times a day (QID) | ORAL | Status: DC | PRN

## 2021-06-18 MED ORDER — VASOPRESSIN 20 UNIT/ML IV SOLN
INTRAVENOUS | Status: DC | PRN
  Administered 2021-06-18 (×4): 2 [IU] via INTRAVENOUS
  Administered 2021-06-18: 1 [IU] via INTRAVENOUS
  Administered 2021-06-18: 2 [IU] via INTRAVENOUS

## 2021-06-18 MED ORDER — MIDAZOLAM HCL 2 MG/2ML IJ SOLN
INTRAMUSCULAR | Status: DC | PRN
  Administered 2021-06-18: 2 mg via INTRAVENOUS

## 2021-06-18 MED ORDER — PHENYLEPHRINE 40 MCG/ML (10ML) SYRINGE FOR IV PUSH (FOR BLOOD PRESSURE SUPPORT)
PREFILLED_SYRINGE | INTRAVENOUS | Status: DC | PRN
  Administered 2021-06-18 (×6): 240 ug via INTRAVENOUS

## 2021-06-18 MED ORDER — SODIUM CHLORIDE 0.9 % IV SOLN
2.0000 g | Freq: Once | INTRAVENOUS | Status: AC
  Administered 2021-06-18: 2 g via INTRAVENOUS
  Filled 2021-06-18: qty 20

## 2021-06-18 MED ORDER — ONDANSETRON HCL 4 MG/2ML IJ SOLN
4.0000 mg | Freq: Once | INTRAMUSCULAR | Status: AC
  Administered 2021-06-18: 4 mg via INTRAVENOUS
  Filled 2021-06-18: qty 2

## 2021-06-18 MED ORDER — ACETAMINOPHEN 500 MG PO TABS
1000.0000 mg | ORAL_TABLET | Freq: Once | ORAL | Status: AC
  Administered 2021-06-18: 1000 mg via ORAL
  Filled 2021-06-18: qty 2

## 2021-06-18 MED ORDER — LIDOCAINE HCL URETHRAL/MUCOSAL 2 % EX GEL
CUTANEOUS | Status: AC
  Filled 2021-06-18: qty 11

## 2021-06-18 MED ORDER — LIDOCAINE 2% (20 MG/ML) 5 ML SYRINGE
INTRAMUSCULAR | Status: DC | PRN
  Administered 2021-06-18: 60 mg via INTRAVENOUS

## 2021-06-18 MED ORDER — MIDAZOLAM HCL 2 MG/2ML IJ SOLN
INTRAMUSCULAR | Status: AC
  Filled 2021-06-18: qty 2

## 2021-06-18 MED ORDER — LACTATED RINGERS IV SOLN: INTRAVENOUS | Status: DC

## 2021-06-18 MED ORDER — SODIUM CHLORIDE 0.9 % IV BOLUS
1000.0000 mL | Freq: Once | INTRAVENOUS | Status: AC
Start: 2021-06-18 — End: 2021-06-18
  Administered 2021-06-18: 1000 mL via INTRAVENOUS

## 2021-06-18 MED ORDER — SENNOSIDES-DOCUSATE SODIUM 8.6-50 MG PO TABS: 1.0000 | ORAL_TABLET | Freq: Every evening | ORAL | Status: DC | PRN

## 2021-06-18 MED ORDER — PROPOFOL 10 MG/ML IV BOLUS
INTRAVENOUS | Status: DC | PRN
  Administered 2021-06-18: 100 mg via INTRAVENOUS

## 2021-06-18 MED ORDER — ACETAMINOPHEN 325 MG PO TABS
650.0000 mg | ORAL_TABLET | Freq: Once | ORAL | Status: AC
  Administered 2021-06-18: 650 mg via ORAL
  Filled 2021-06-18: qty 2

## 2021-06-18 MED ORDER — ACETAMINOPHEN 500 MG PO TABS: 500.0000 mg | ORAL_TABLET | Freq: Four times a day (QID) | ORAL | Status: DC | PRN

## 2021-06-18 MED ORDER — SODIUM CHLORIDE 0.9 % IV BOLUS
1000.0000 mL | Freq: Once | INTRAVENOUS | Status: AC
  Administered 2021-06-18: 1000 mL via INTRAVENOUS

## 2021-06-18 MED ORDER — LACTATED RINGERS IV BOLUS
1000.0000 mL | Freq: Once | INTRAVENOUS | Status: AC
  Administered 2021-06-18: 1000 mL via INTRAVENOUS

## 2021-06-18 MED ORDER — IOHEXOL 300 MG/ML  SOLN
INTRAMUSCULAR | Status: DC | PRN
  Administered 2021-06-18: 5 mL via URETHRAL

## 2021-06-18 MED ORDER — ONDANSETRON HCL 4 MG/2ML IJ SOLN
INTRAMUSCULAR | Status: DC | PRN
  Administered 2021-06-18: 4 mg via INTRAVENOUS

## 2021-06-18 MED ORDER — SODIUM CHLORIDE 0.9 % IV SOLN
2.0000 g | INTRAVENOUS | Status: DC
  Administered 2021-06-19 – 2021-06-20 (×2): 2 g via INTRAVENOUS
  Filled 2021-06-18 (×2): qty 20

## 2021-06-18 MED ORDER — FENTANYL CITRATE (PF) 250 MCG/5ML IJ SOLN
INTRAMUSCULAR | Status: DC | PRN
  Administered 2021-06-18: 50 ug via INTRAVENOUS

## 2021-06-18 MED ORDER — VASOPRESSIN 20 UNIT/ML IV SOLN
INTRAVENOUS | Status: AC
  Filled 2021-06-18: qty 1

## 2021-06-18 MED ORDER — ACETAMINOPHEN 325 MG PO TABS
650.0000 mg | ORAL_TABLET | Freq: Four times a day (QID) | ORAL | Status: DC | PRN
  Administered 2021-06-20: 650 mg via ORAL
  Filled 2021-06-18: qty 2

## 2021-06-18 SURGICAL SUPPLY — 22 items
BAG URINE DRAIN 2000ML AR STRL (UROLOGICAL SUPPLIES) ×1 IMPLANT
BAG URO CATCHER STRL LF (MISCELLANEOUS) ×2 IMPLANT
CATH FOLEY 2WAY SLVR  5CC 16FR (CATHETERS) ×2
CATH FOLEY 2WAY SLVR 5CC 16FR (CATHETERS) IMPLANT
CATH URET 5FR 28IN OPEN ENDED (CATHETERS) ×2 IMPLANT
GLOVE SURG ENC MOIS LTX SZ6.5 (GLOVE) ×2 IMPLANT
GOWN STRL REUS W/ TWL LRG LVL3 (GOWN DISPOSABLE) ×1 IMPLANT
GOWN STRL REUS W/ TWL XL LVL3 (GOWN DISPOSABLE) ×1 IMPLANT
GOWN STRL REUS W/TWL LRG LVL3 (GOWN DISPOSABLE) ×2
GOWN STRL REUS W/TWL XL LVL3 (GOWN DISPOSABLE) ×2
GUIDEWIRE ANG ZIPWIRE 038X150 (WIRE) ×1 IMPLANT
GUIDEWIRE STR DUAL SENSOR (WIRE) ×2 IMPLANT
KIT TURNOVER KIT B (KITS) ×2 IMPLANT
MANIFOLD NEPTUNE II (INSTRUMENTS) IMPLANT
NS IRRIG 1000ML POUR BTL (IV SOLUTION) IMPLANT
PACK CYSTO (CUSTOM PROCEDURE TRAY) ×2 IMPLANT
STENT URET 6FRX24 CONTOUR (STENTS) IMPLANT
STENT URET 6FRX26 CONTOUR (STENTS) ×1 IMPLANT
SYPHON OMNI JUG (MISCELLANEOUS) ×1 IMPLANT
TOWEL GREEN STERILE FF (TOWEL DISPOSABLE) ×2 IMPLANT
TUBE CONNECTING 12X1/4 (SUCTIONS) IMPLANT
WATER STERILE IRR 3000ML UROMA (IV SOLUTION) ×2 IMPLANT

## 2021-06-18 NOTE — Anesthesia Procedure Notes (Signed)
Procedure Name: LMA Insertion Date/Time: 06/18/2021 5:13 PM Performed by: Cy Blamer, CRNA Patient Re-evaluated:Patient Re-evaluated prior to induction Oxygen Delivery Method: Circle system utilized Preoxygenation: Pre-oxygenation with 100% oxygen Induction Type: IV induction LMA: LMA inserted LMA Size: 5.0 Number of attempts: 1 Tube secured with: Tape Dental Injury: Teeth and Oropharynx as per pre-operative assessment

## 2021-06-18 NOTE — Interval H&P Note (Signed)
History and Physical Interval Note:  06/18/2021 4:56 PM  Jordan Patrick  has presented today for surgery, with the diagnosis of Kidney Stone.  The various methods of treatment have been discussed with the patient and family. After consideration of risks, benefits and other options for treatment, the patient has consented to  Procedure(s): CYSTOSCOPY WITH RETROGRADE PYELOGRAM, URETEROSCOPY AND STENT PLACEMENT (Right) as a surgical intervention.  The patient's history has been reviewed, patient examined, no change in status, stable for surgery.  I have reviewed the patient's chart and labs.  Questions were answered to the patient's satisfaction.     Kathlen Sakurai D Nataliyah Packham

## 2021-06-18 NOTE — Transfer of Care (Signed)
Immediate Anesthesia Transfer of Care Note  Patient: Linkyn Gobin  Procedure(s) Performed: CYSTOSCOPY WITH RETROGRADE PYELOGRAM, URETEROSCOPY AND STENT PLACEMENT (Right)  Patient Location: PACU  Anesthesia Type:General  Level of Consciousness: awake, alert  and oriented  Airway & Oxygen Therapy: Patient Spontanous Breathing and Patient connected to face mask oxygen  Post-op Assessment: Report given to RN, Post -op Vital signs reviewed and stable, Patient moving all extremities X 4 and Patient able to stick tongue midline  Post vital signs: stable  Last Vitals:  Vitals Value Taken Time  BP 103/75 06/18/21 1745  Temp 97.8   Pulse 102 06/18/21 1747  Resp 22 06/18/21 1747  SpO2 100 % 06/18/21 1747    Last Pain:  Vitals:   06/18/21 1632  TempSrc: Oral  PainSc: 5          Complications: No notable events documented.

## 2021-06-18 NOTE — ED Provider Notes (Signed)
Emergency Medicine Provider Triage Evaluation Note  Jordan Patrick , a 50 y.o. male  was evaluated in triage.  Pt complains of gradual onset, constant, sharp, right sided flank pain radiating into RLQ that began 3 days ago. Also complains of nausea, vomiting, fever. Febrile here at 102.6. He also complains of hematuria. He reports kidney stone last year and did not have any issues until this weekend.   Review of Systems  Positive: + abdominal pain, nausea, vomiting, fever Negative: - diarrhea, testicular pain  Physical Exam  BP 108/81 (BP Location: Right Arm)   Pulse (!) 113   Temp (!) 102.6 F (39.2 C)   Resp 18   SpO2 99%  Gen:   Awake, no distress   Resp:  Normal effort  MSK:   Moves extremities without difficulty  Other:  Right CVA TTP and RLQ TTP. No rebound or guarding.   Medical Decision Making  Medically screening exam initiated at 9:22 AM.  Appropriate orders placed.  Jordan Patrick was informed that the remainder of the evaluation will be completed by another provider, this initial triage assessment does not replace that evaluation, and the importance of remaining in the ED until their evaluation is complete.  Question infected kidney stone vs concern for appendicitis? Currently meets sepsis criteria. Triage Nurse Jordan Patrick made aware pt should be taken to a room as soon as possible.    Jordan Rockers, PA-C 06/18/21 9242    Jordan Plan, DO 06/18/21 1252

## 2021-06-18 NOTE — H&P (Signed)
History and Physical    Jordan Patrick HBZ:[id number] DOB: 1971/03/08 DOA: 06/18/2021  PCP: Pcp, No (Confirm with patient/family/NH records and if not entered, this has to be entered at [facility] point of entry) Patient coming from: Home  I have personally briefly reviewed patient's old medical records in [facility]  Chief Complaint: Right flank pain  HPI: Jordan Patrick is a 50 y.o. male with medical history significant of recurrent kidney stones, presented with new onset of right-sided flank pain, nauseous vomiting and hematuria.  Symptoms started last Friday, initially was cramping-like right sided/flank pain radiated to the right groin, yesterday, symptoms became severe, 10/10, associated with nauseous vomit of stomach content, and episode of fever and chills.  Last night, started to have trouble urinate, "Had to strain to pee" and each time only small amount of urine,.  Then suddenly, started to pass large amount of hematuria, bright red color, several times last night.  The pain appears to improved this morning, 4/10, still having fever and chills.  Still feel nauseous but no more vomiting.  ED Course: Fever 102.6, blood pressure stable, tachycardia.  Blood work leukocytosis 21.6, CT renal stone possible right-sided hydronephrosis and hydroureter but no significant obstructions.  Review of Systems: As per HPI otherwise 14 point review of systems negative.    Past Medical History:  Diagnosis Date   Tuberculosis    when he was 50 years old he was treated for TB and was in the hospital for 4 months    Past Surgical History:  Procedure Laterality Date   I & D EXTREMITY  05/21/2012   Procedure: IRRIGATION AND DEBRIDEMENT EXTREMITY;  Surgeon: Dominica Severin, MD;  Location: [facility] OR;  Service: Orthopedics;  Laterality: Right;  with tendon repair   NO PAST SURGERIES       reports that he has never smoked. He has never used smokeless tobacco. He reports current alcohol use. He  reports that he does not use drugs.  No Known Allergies  No family history on file.   Prior to Admission medications   Medication Sig Start Date End Date Taking? Authorizing Provider  acetaminophen (TYLENOL) 500 MG tablet Take 500 mg by mouth every 6 (six) hours as needed for mild pain or moderate pain.   Yes [provider]  cephALEXin (KEFLEX) 250 MG capsule Take 500 mg by mouth 3 (three) times daily. For 10 days. Filled on 05/19/12 should finish 05/29/12 Patient not taking: Reported on 06/18/2021    [provider]  HYDROcodone-acetaminophen (VICODIN) 5-500 MG per tablet Take 1 tablet by mouth every 6 (six) hours as needed. For pain Patient not taking: Reported on 06/18/2021    [provider]  methocarbamol (ROBAXIN) 500 MG tablet Take 2 tablets (1,000 mg total) by mouth 3 (three) times daily. Patient not taking: No sig reported 07/17/20   Renne Crigler, PA-C  naproxen (NAPROSYN) 500 MG tablet Take 1 tablet (500 mg total) by mouth 2 (two) times daily. Patient not taking: No sig reported 07/17/20   Renne Crigler, PA-C  oxyCODONE-acetaminophen (PERCOCET) 5-325 MG tablet Take 1 tablet by mouth every 4 (four) hours as needed. Patient not taking: No sig reported 03/10/21   Garlon Hatchet, PA-C    Physical Exam: Vitals:   06/18/21 0922 06/18/21 1239 06/18/21 1345 06/18/21 1400  BP: 108/81 98/74 (!) 140/110 111/75  Pulse: (!) 113 86 (!) 123 (!) 118  Resp: 18 20 18  (!) 22  Temp: (!) 102.6 F (39.2 C) 99.6  F (37.6 C)  (!) 102.2 F (39 C)  TempSrc:  Oral  Rectal  SpO2: 99% 97% 99% 97%    Constitutional: NAD, calm, comfortable Vitals:   06/18/21 0922 06/18/21 1239 06/18/21 1345 06/18/21 1400  BP: 108/81 98/74 (!) 140/110 111/75  Pulse: (!) 113 86 (!) 123 (!) 118  Resp: 18 20 18  (!) 22  Temp: (!) 102.6 F (39.2 C) 99.6 F (37.6 C)  (!) 102.2 F (39 C)  TempSrc:  Oral  Rectal  SpO2: 99% 97% 99% 97%   Eyes: PERRL, lids and conjunctivae normal ENMT:  Mucous membranes are moist. Posterior pharynx clear of any exudate or lesions.Normal dentition.  Neck: normal, supple, no masses, no thyromegaly Respiratory: clear to auscultation bilaterally, no wheezing, no crackles. Normal respiratory effort. No accessory muscle use.  Cardiovascular: Regular rate and rhythm, no murmurs / rubs / gallops. No extremity edema. 2+ pedal pulses. No carotid bruits.  Abdomen: mild right flank tenderness and CVA tenderness, no masses palpated. No hepatosplenomegaly. Bowel sounds positive.  Musculoskeletal: no clubbing / cyanosis. No joint deformity upper and lower extremities. Good ROM, no contractures. Normal muscle tone.  Skin: no rashes, lesions, ulcers. No induration Neurologic: CN 2-12 grossly intact. Sensation intact, DTR normal. Strength 5/5 in all 4.  Psychiatric: Normal judgment and insight. Alert and oriented x 3. Normal mood.   (  Labs on Admission: I have personally reviewed following labs and imaging studies  CBC: Recent Labs  Lab 06/18/21 0927  WBC 21.6*  NEUTROABS 19.7*  HGB 13.2  HCT 39.0  MCV 94.0  PLT 365   Basic Metabolic Panel: Recent Labs  Lab 06/18/21 0927  NA 135  K 3.8  CL 103  CO2 21*  GLUCOSE 102*  BUN 15  CREATININE 1.13  CALCIUM 9.0   GFR: CrCl cannot be calculated (Unknown ideal weight.). Liver Function Tests: Recent Labs  Lab 06/18/21 0927  AST 20  ALT 29  ALKPHOS 50  BILITOT 1.1  PROT 7.0  ALBUMIN 3.5   Recent Labs  Lab 06/18/21 0927  LIPASE 38   No results for input(s): AMMONIA in the last 168 hours. Coagulation Profile: No results for input(s): INR, PROTIME in the last 168 hours. Cardiac Enzymes: No results for input(s): CKTOTAL, CKMB, CKMBINDEX, TROPONINI in the last 168 hours. BNP (last 3 results) No results for input(s): PROBNP in the last 8760 hours. HbA1C: No results for input(s): HGBA1C in the last 72 hours. CBG: No results for input(s): GLUCAP in the last 168 hours. Lipid  Profile: No results for input(s): CHOL, HDL, LDLCALC, TRIG, CHOLHDL, LDLDIRECT in the last 72 hours. Thyroid Function Tests: No results for input(s): TSH, T4TOTAL, FREET4, T3FREE, THYROIDAB in the last 72 hours. Anemia Panel: No results for input(s): VITAMINB12, FOLATE, FERRITIN, TIBC, IRON, RETICCTPCT in the last 72 hours. Urine analysis:    Component Value Date/Time   COLORURINE YELLOW 06/18/2021 0927   APPEARANCEUR CLOUDY (A) 06/18/2021 0927   LABSPEC 1.027 06/18/2021 0927   PHURINE 5.0 06/18/2021 0927   GLUCOSEU NEGATIVE 06/18/2021 0927   HGBUR LARGE (A) 06/18/2021 0927   BILIRUBINUR NEGATIVE 06/18/2021 0927   KETONESUR 5 (A) 06/18/2021 0927   PROTEINUR 100 (A) 06/18/2021 0927   NITRITE NEGATIVE 06/18/2021 0927   LEUKOCYTESUR LARGE (A) 06/18/2021 0927    Radiological Exams on Admission: CT Renal Stone Study  Result Date: 06/18/2021 CLINICAL DATA:  Flank pain, kidney stone suspected EXAM: CT ABDOMEN AND PELVIS WITHOUT CONTRAST TECHNIQUE: Multidetector CT imaging of the abdomen  and pelvis was performed following the standard protocol without IV contrast. COMPARISON:  None. FINDINGS: Lower chest: No acute abnormality. Hepatobiliary: No focal liver abnormality is seen. No gallstones, gallbladder wall thickening, or biliary dilatation. Pancreas: Unremarkable. Spleen: Unremarkable. Adrenals/Urinary Tract: Adrenals are unremarkable. There is right hydronephrosis. Proximal right hydroureter. There is abrupt transition to decompressed ureter without apparent stone. Left kidney is unremarkable. Partially distended bladder is unremarkable. Stomach/Bowel: Stomach is within normal limits. Bowel is normal in caliber. Normal appendix. Vascular/Lymphatic: No significant vascular abnormality on this noncontrast study. No enlarged lymph nodes. Reproductive: Unremarkable. Other: No free fluid.  Abdominal wall is unremarkable. 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 No acute osseous abnormality. IMPRESSION: Right hydronephrosis and proximal right hydroureter with abrupt transition to a decompressed ureter. No apparent stone. Recommend contrast enhanced urogram with delay to better evaluate the ureter. Electronically Signed   By: Guadlupe Spanish M.D.   On: 06/18/2021 12:54    EKG: NOne  Assessment/Plan Active Problems:   Sepsis (HCC)  (please populate well all problems here in Problem List. (For example, if patient is on BP meds at home and you resume or decide to hold them, it is a problem that needs to be her. Same for CAD, COPD, HLD and so on)  Sepsis secondary to complicated UTI and right-sided pyelonephritis, improving -Evidenced by fever, tachycardia and leukocytosis, no significant endorgan damage thus far, source is urine infection. -Appears that the stone may have passed? -Continue ceftriaxone 2 g daily, urine culture pending. -Of Flomax.  Right-sided obstructive uropathy secondary to right sided obstructive ureteral stone/kidney stone -No stone identified on today's CT scan.  May be radiopaque stone versus stone has passed. -NPO, in case patient will need OR and right-sided stenting. -IVF and pain control.  DVT prophylaxis: SCD Code Status: Full Code Family Communication: Wife at bedside Disposition Plan: Expect more than 2 midnight hospital stay to treat sepsis. Consults called: Urology Admission status: Tele admit   Emeline General MD [facility] Pager [phone]  06/18/2021, 2:56 PM

## 2021-06-18 NOTE — Progress Notes (Signed)
Patient underwent cystoscopy, right ureteroscope and stent placement.  Purulent discharge identified from right ureter.  Blood pressure running low during the OR and PACU, received a total of 2 L of LR bolus.  Right now SBP lower 80s, patient sleepy, will order another 1 L of normal saline bolus.  Escalate admission to PCU for close monitoring.  Blood cultures x2.

## 2021-06-18 NOTE — Consult Note (Signed)
I have been asked to see the patient by Dr. Melene Plan, for evaluation and management of right hydronephrosis, pain, and UTI.  History of present illness: 50 year old man presented to the ED with right lower quadrant pain, right flank pain and hematuria.  He is febrile in the ED to 102.6.  He does have a history of kidney stones but has never had surgery for them.  He denies any surgery on his kidneys before.  CT of the abdomen pelvis shows right hydronephrosis to a transition point in the proximal ureter with decompressed ureter distally.  WBC 21.6 which was decreased from 12 days ago at time of strep pharyngitis.  UA however is consistent with infection.   Review of systems: A 12 point comprehensive review of systems was obtained and is negative unless otherwise stated in the history of present illness.  There are no problems to display for this patient.   No current facility-administered medications on file prior to encounter.   Current Outpatient Medications on File Prior to Encounter  Medication Sig Dispense Refill   cephALEXin (KEFLEX) 250 MG capsule Take 500 mg by mouth 3 (three) times daily. For 10 days. Filled on 05/19/12 should finish 05/29/12     HYDROcodone-acetaminophen (VICODIN) 5-500 MG per tablet Take 1 tablet by mouth every 6 (six) hours as needed. For pain     methocarbamol (ROBAXIN) 500 MG tablet Take 2 tablets (1,000 mg total) by mouth 3 (three) times daily. 30 tablet 0   naproxen (NAPROSYN) 500 MG tablet Take 1 tablet (500 mg total) by mouth 2 (two) times daily. 30 tablet 0   oxyCODONE-acetaminophen (PERCOCET) 5-325 MG tablet Take 1 tablet by mouth every 4 (four) hours as needed. 20 tablet 0    Past Medical History:  Diagnosis Date   Tuberculosis    when he was 50 years old he was treated for TB and was in the hospital for 4 months    Past Surgical History:  Procedure Laterality Date   I & D EXTREMITY  05/21/2012   Procedure: IRRIGATION AND DEBRIDEMENT EXTREMITY;   Surgeon: Dominica Severin, MD;  Location: [facility] OR;  Service: Orthopedics;  Laterality: Right;  with tendon repair   NO PAST SURGERIES      Social History   Tobacco Use   Smoking status: Never   Smokeless tobacco: Never  Substance Use Topics   Alcohol use: Yes    Comment: occasional alcohol   Drug use: No    No family history on file.  PE: Vitals:   06/18/21 0922 06/18/21 1239  BP: 108/81 98/74  Pulse: (!) 113 86  Resp: 18 20  Temp: (!) 102.6 F (39.2 C) 99.6 F (37.6 C)  TempSrc:  Oral  SpO2: 99% 97%   Patient appears to be in no acute distress  patient is alert and oriented x3 Atraumatic normocephalic head No cervical or supraclavicular lymphadenopathy appreciated No increased work of breathing, no audible wheezes/rhonchi Regular sinus rhythm/rate Abdomen is soft, RLQ ttp, +right CVA ttp Lower extremities are symmetric without appreciable edema Grossly neurologically intact No identifiable skin lesions  Recent Labs    06/18/21 0927  WBC 21.6*  HGB 13.2  HCT 39.0   Recent Labs    06/18/21 0927  NA 135  K 3.8  CL 103  CO2 21*  GLUCOSE 102*  BUN 15  CREATININE 1.13  CALCIUM 9.0   No results for input(s): LABPT, INR in the last 72 hours. No results for input(s): LABURIN in the  last 72 hours. Results for orders placed or performed during the hospital encounter of 06/06/21  Group A Strep by PCR     Status: None   Collection Time: 06/06/21  9:45 PM   Specimen: Throat; Sterile Swab  Result Value Ref Range Status   Group A Strep by PCR NOT DETECTED NOT DETECTED Final    Comment: Performed at [facility], [address] [place], [place] [address]    Imaging: CT the abdomen pelvis without contrast 06/18/2021 IMPRESSION: Right hydronephrosis and proximal right hydroureter with abrupt transition to a decompressed ureter. No apparent stone. Recommend contrast enhanced urogram with delay to better evaluate the ureter.     Electronically Signed    By: Guadlupe Spanish M.D.   On: 06/18/2021 12:54  Imp/Recommendations: Right hydronephrosis with UTI: No clear ureteral calculus is seen however review of imaging does show hydronephrosis with decompressed distal ureter.  I called and reviewed scan with radiology.  There is no clear crossing vessel although there is a possibility of UPJ obstruction.  Given patient's fever, urinalysis, white count and hydronephrosis we will proceed with cystoscopy with right ureteral stent placement.  I discussed the risks and benefits of this procedure with the patient in detail including but not limited to pain, bleeding, infection, flank pain, stent discomfort, urgency/frequency, hematuria, damage to trying structures, need for additional scans and studies.  Will proceed with surgery today.   Thank you for involving me in this patient's care.  Please page with any further questions or concerns. Kem Hensen D Adrianne Shackleton

## 2021-06-18 NOTE — H&P (View-Only) (Signed)
I have been asked to see the patient by Dr. Melene Plan, for evaluation and management of right hydronephrosis, pain, and UTI.  History of present illness: 50 year old man presented to the ED with right lower quadrant pain, right flank pain and hematuria.  He is febrile in the ED to 102.6.  He does have a history of kidney stones but has never had surgery for them.  He denies any surgery on his kidneys before.  CT of the abdomen pelvis shows right hydronephrosis to a transition point in the proximal ureter with decompressed ureter distally.  WBC 21.6 which was decreased from 12 days ago at time of strep pharyngitis.  UA however is consistent with infection.   Review of systems: A 12 point comprehensive review of systems was obtained and is negative unless otherwise stated in the history of present illness.  There are no problems to display for this patient.   No current facility-administered medications on file prior to encounter.   Current Outpatient Medications on File Prior to Encounter  Medication Sig Dispense Refill   cephALEXin (KEFLEX) 250 MG capsule Take 500 mg by mouth 3 (three) times daily. For 10 days. Filled on 05/19/12 should finish 05/29/12     HYDROcodone-acetaminophen (VICODIN) 5-500 MG per tablet Take 1 tablet by mouth every 6 (six) hours as needed. For pain     methocarbamol (ROBAXIN) 500 MG tablet Take 2 tablets (1,000 mg total) by mouth 3 (three) times daily. 30 tablet 0   naproxen (NAPROSYN) 500 MG tablet Take 1 tablet (500 mg total) by mouth 2 (two) times daily. 30 tablet 0   oxyCODONE-acetaminophen (PERCOCET) 5-325 MG tablet Take 1 tablet by mouth every 4 (four) hours as needed. 20 tablet 0    Past Medical History:  Diagnosis Date   Tuberculosis    when he was 50 years old he was treated for TB and was in the hospital for 4 months    Past Surgical History:  Procedure Laterality Date   I & D EXTREMITY  05/21/2012   Procedure: IRRIGATION AND DEBRIDEMENT EXTREMITY;   Surgeon: Dominica Severin, MD;  Location: [facility] OR;  Service: Orthopedics;  Laterality: Right;  with tendon repair   NO PAST SURGERIES      Social History   Tobacco Use   Smoking status: Never   Smokeless tobacco: Never  Substance Use Topics   Alcohol use: Yes    Comment: occasional alcohol   Drug use: No    No family history on file.  PE: Vitals:   06/18/21 0922 06/18/21 1239  BP: 108/81 98/74  Pulse: (!) 113 86  Resp: 18 20  Temp: (!) 102.6 F (39.2 C) 99.6 F (37.6 C)  TempSrc:  Oral  SpO2: 99% 97%   Patient appears to be in no acute distress  patient is alert and oriented x3 Atraumatic normocephalic head No cervical or supraclavicular lymphadenopathy appreciated No increased work of breathing, no audible wheezes/rhonchi Regular sinus rhythm/rate Abdomen is soft, RLQ ttp, +right CVA ttp Lower extremities are symmetric without appreciable edema Grossly neurologically intact No identifiable skin lesions  Recent Labs    06/18/21 0927  WBC 21.6*  HGB 13.2  HCT 39.0   Recent Labs    06/18/21 0927  NA 135  K 3.8  CL 103  CO2 21*  GLUCOSE 102*  BUN 15  CREATININE 1.13  CALCIUM 9.0   No results for input(s): LABPT, INR in the last 72 hours. No results for input(s): LABURIN in the  last 72 hours. Results for orders placed or performed during the hospital encounter of 06/06/21  Group A Strep by PCR     Status: None   Collection Time: 06/06/21  9:45 PM   Specimen: Throat; Sterile Swab  Result Value Ref Range Status   Group A Strep by PCR NOT DETECTED NOT DETECTED Final    Comment: Performed at [facility], [address] [place], [place] [address]    Imaging: CT the abdomen pelvis without contrast 06/18/2021 IMPRESSION: Right hydronephrosis and proximal right hydroureter with abrupt transition to a decompressed ureter. No apparent stone. Recommend contrast enhanced urogram with delay to better evaluate the ureter.     Electronically Signed    By: Guadlupe Spanish M.D.   On: 06/18/2021 12:54  Imp/Recommendations: Right hydronephrosis with UTI: No clear ureteral calculus is seen however review of imaging does show hydronephrosis with decompressed distal ureter.  I called and reviewed scan with radiology.  There is no clear crossing vessel although there is a possibility of UPJ obstruction.  Given patient's fever, urinalysis, white count and hydronephrosis we will proceed with cystoscopy with right ureteral stent placement.  I discussed the risks and benefits of this procedure with the patient in detail including but not limited to pain, bleeding, infection, flank pain, stent discomfort, urgency/frequency, hematuria, damage to trying structures, need for additional scans and studies.  Will proceed with surgery today.   Thank you for involving me in this patient's care.  Please page with any further questions or concerns. Anders Hohmann D Kacey Vicuna

## 2021-06-18 NOTE — ED Triage Notes (Signed)
Pt. Stated,  im having rt. Side stomach pain and I have a kidney stone for one year.

## 2021-06-18 NOTE — Progress Notes (Signed)
Patient's significant other Areli [phone]

## 2021-06-18 NOTE — ED Notes (Signed)
Patient transported to CT 

## 2021-06-18 NOTE — ED Notes (Signed)
Pt appearing better despite v/s. Sitting up in bed talking with family. NAD.

## 2021-06-18 NOTE — H&P (View-Only) (Signed)
I have been asked to see the patient by Dr. Dan Floyd, for evaluation and management of right hydronephrosis, pain, and UTI.  History of present illness: 49-year-old man presented to the ED with right lower quadrant pain, right flank pain and hematuria.  He is febrile in the ED to 102.6.  He does have a history of kidney stones but has never had surgery for them.  He denies any surgery on his kidneys before.  CT of the abdomen pelvis shows right hydronephrosis to a transition point in the proximal ureter with decompressed ureter distally.  WBC 21.6 which was decreased from 12 days ago at time of strep pharyngitis.  UA however is consistent with infection.   Review of systems: A 12 point comprehensive review of systems was obtained and is negative unless otherwise stated in the history of present illness.  There are no problems to display for this patient.   No current facility-administered medications on file prior to encounter.   Current Outpatient Medications on File Prior to Encounter  Medication Sig Dispense Refill   cephALEXin (KEFLEX) 250 MG capsule Take 500 mg by mouth 3 (three) times daily. For 10 days. Filled on 05/19/12 should finish 05/29/12     HYDROcodone-acetaminophen (VICODIN) 5-500 MG per tablet Take 1 tablet by mouth every 6 (six) hours as needed. For pain     methocarbamol (ROBAXIN) 500 MG tablet Take 2 tablets (1,000 mg total) by mouth 3 (three) times daily. 30 tablet 0   naproxen (NAPROSYN) 500 MG tablet Take 1 tablet (500 mg total) by mouth 2 (two) times daily. 30 tablet 0   oxyCODONE-acetaminophen (PERCOCET) 5-325 MG tablet Take 1 tablet by mouth every 4 (four) hours as needed. 20 tablet 0    Past Medical History:  Diagnosis Date   Tuberculosis    when he was 50 years old he was treated for TB and was in the hospital for 4 months    Past Surgical History:  Procedure Laterality Date   I & D EXTREMITY  05/21/2012   Procedure: IRRIGATION AND DEBRIDEMENT EXTREMITY;   Surgeon: William Gramig, MD;  Location: [facility] OR;  Service: Orthopedics;  Laterality: Right;  with tendon repair   NO PAST SURGERIES      Social History   Tobacco Use   Smoking status: Never   Smokeless tobacco: Never  Substance Use Topics   Alcohol use: Yes    Comment: occasional alcohol   Drug use: No    No family history on file.  PE: Vitals:   06/18/21 0922 06/18/21 1239  BP: 108/81 98/74  Pulse: (!) 113 86  Resp: 18 20  Temp: (!) 102.6 F (39.2 C) 99.6 F (37.6 C)  TempSrc:  Oral  SpO2: 99% 97%   Patient appears to be in no acute distress  patient is alert and oriented x3 Atraumatic normocephalic head No cervical or supraclavicular lymphadenopathy appreciated No increased work of breathing, no audible wheezes/rhonchi Regular sinus rhythm/rate Abdomen is soft, RLQ ttp, +right CVA ttp Lower extremities are symmetric without appreciable edema Grossly neurologically intact No identifiable skin lesions  Recent Labs    06/18/21 0927  WBC 21.6*  HGB 13.2  HCT 39.0   Recent Labs    06/18/21 0927  NA 135  K 3.8  CL 103  CO2 21*  GLUCOSE 102*  BUN 15  CREATININE 1.13  CALCIUM 9.0   No results for input(s): LABPT, INR in the last 72 hours. No results for input(s): LABURIN in the   last 72 hours. Results for orders placed or performed during the hospital encounter of 06/06/21  Group A Strep by PCR     Status: None   Collection Time: 06/06/21  9:45 PM   Specimen: Throat; Sterile Swab  Result Value Ref Range Status   Group A Strep by PCR NOT DETECTED NOT DETECTED Final    Comment: Performed at [facility], [address] [place], [place] [address]    Imaging: CT the abdomen pelvis without contrast 06/18/2021 IMPRESSION: Right hydronephrosis and proximal right hydroureter with abrupt transition to a decompressed ureter. No apparent stone. Recommend contrast enhanced urogram with delay to better evaluate the ureter.     Electronically Signed    By: Guadlupe Spanish M.D.   On: 06/18/2021 12:54  Imp/Recommendations: Right hydronephrosis with UTI: No clear ureteral calculus is seen however review of imaging does show hydronephrosis with decompressed distal ureter.  I called and reviewed scan with radiology.  There is no clear crossing vessel although there is a possibility of UPJ obstruction.  Given patient's fever, urinalysis, white count and hydronephrosis we will proceed with cystoscopy with right ureteral stent placement.  I discussed the risks and benefits of this procedure with the patient in detail including but not limited to pain, bleeding, infection, flank pain, stent discomfort, urgency/frequency, hematuria, damage to trying structures, need for additional scans and studies.  Will proceed with surgery today.   Thank you for involving me in this patient's care.  Please page with any further questions or concerns. Esker Dever D Benicio Manna

## 2021-06-18 NOTE — ED Provider Notes (Signed)
[facility] EMERGENCY DEPARTMENT Provider Note   CSN: [id number] Arrival date & time: 06/18/21  3557     History Chief Complaint  Patient presents with   Abdominal Pain   Nausea   Emesis    Jordan Patrick is a 50 y.o. male who presents to the ED today with complaint of gradual onset, constant, waxing and waning, sharp, right sided flank pain that began 3 days ago. Also complains of nausea, vomiting, and fevers over the weekend. He also complains of hematuria. He reports kidney stones in the past however has always passed them on his own. No diarrhea. No previous abdominal surgeries.   The history is provided by the patient and medical records.      Past Medical History:  Diagnosis Date   Tuberculosis    when he was 50 years old he was treated for TB and was in the hospital for 4 months    Patient Active Problem List   Diagnosis Date Noted   Sepsis (HCC) 06/18/2021   Kidney stone      Past Surgical History:  Procedure Laterality Date   I & D EXTREMITY  05/21/2012   Procedure: IRRIGATION AND DEBRIDEMENT EXTREMITY;  Surgeon: Dominica Severin, MD;  Location: [facility] OR;  Service: Orthopedics;  Laterality: Right;  with tendon repair   NO PAST SURGERIES         History reviewed. No pertinent family history.  Social History   Tobacco Use   Smoking status: Never   Smokeless tobacco: Never  Substance Use Topics   Alcohol use: Yes    Comment: occasional alcohol   Drug use: No    Home Medications Prior to Admission medications   Medication Sig Start Date End Date Taking? Authorizing Provider  acetaminophen (TYLENOL) 500 MG tablet Take 500 mg by mouth every 6 (six) hours as needed for mild pain or moderate pain.   Yes [provider]  cephALEXin (KEFLEX) 250 MG capsule Take 500 mg by mouth 3 (three) times daily. For 10 days. Filled on 05/19/12 should finish 05/29/12 Patient not taking: Reported on 06/18/2021    [provider]   HYDROcodone-acetaminophen (VICODIN) 5-500 MG per tablet Take 1 tablet by mouth every 6 (six) hours as needed. For pain Patient not taking: Reported on 06/18/2021    [provider]  methocarbamol (ROBAXIN) 500 MG tablet Take 2 tablets (1,000 mg total) by mouth 3 (three) times daily. Patient not taking: No sig reported 07/17/20   Renne Crigler, PA-C  naproxen (NAPROSYN) 500 MG tablet Take 1 tablet (500 mg total) by mouth 2 (two) times daily. Patient not taking: No sig reported 07/17/20   Renne Crigler, PA-C  oxyCODONE-acetaminophen (PERCOCET) 5-325 MG tablet Take 1 tablet by mouth every 4 (four) hours as needed. Patient not taking: No sig reported 03/10/21   Garlon Hatchet, PA-C    Allergies    Patient has no known allergies.  Review of Systems   Review of Systems  Constitutional:  Positive for fever.  Gastrointestinal:  Positive for abdominal pain, nausea and vomiting.  Genitourinary:  Positive for flank pain and hematuria.  All other systems reviewed and are negative.  Physical Exam Updated Vital Signs BP (!) 108/59   Pulse (!) 108   Temp (!) 97.3 F (36.3 C)   Resp 17   Wt 70.2 kg   SpO2 95%   BMI 25.74 kg/m   Physical Exam Vitals and nursing note reviewed.  Constitutional:  Appearance: He is not ill-appearing.  HENT:     Head: Normocephalic and atraumatic.  Eyes:     Conjunctiva/sclera: Conjunctivae normal.  Cardiovascular:     Rate and Rhythm: Regular rhythm.     Heart sounds: Normal heart sounds.  Pulmonary:     Effort: Pulmonary effort is normal.     Breath sounds: Normal breath sounds. No wheezing, rhonchi or rales.  Abdominal:     Palpations: Abdomen is soft.     Tenderness: There is abdominal tenderness in the right lower quadrant. There is no guarding or rebound.  Musculoskeletal:     Cervical back: Neck supple.  Skin:    General: Skin is warm and dry.  Neurological:     Mental Status: He is alert.    ED Results / Procedures /  Treatments   Labs (all labs ordered are listed, but only abnormal results are displayed) Labs Reviewed  CBC WITH DIFFERENTIAL/PLATELET - Abnormal; Notable for the following components:      Result Value   WBC 21.6 (*)    RBC 4.15 (*)    Neutro Abs 19.7 (*)    Abs Immature Granulocytes 0.12 (*)    All other components within normal limits  URINALYSIS, ROUTINE W REFLEX MICROSCOPIC - Abnormal; Notable for the following components:   APPearance CLOUDY (*)    Hgb urine dipstick LARGE (*)    Ketones, ur 5 (*)    Protein, ur 100 (*)    Leukocytes,Ua LARGE (*)    RBC / HPF >50 (*)    WBC, UA >50 (*)    Bacteria, UA MANY (*)    All other components within normal limits  COMPREHENSIVE METABOLIC PANEL - Abnormal; Notable for the following components:   CO2 21 (*)    Glucose, Bld 102 (*)    All other components within normal limits  RESP PANEL BY RT-PCR (FLU A&B, COVID) ARPGX2  URINE CULTURE  LACTIC ACID, PLASMA  LACTIC ACID, PLASMA  LIPASE, BLOOD  LACTIC ACID, PLASMA  HIV ANTIBODY (ROUTINE TESTING W REFLEX)  BASIC METABOLIC PANEL  CBC    EKG None  Radiology CT Renal Stone Study  Result Date: 06/18/2021 CLINICAL DATA:  Flank pain, kidney stone suspected EXAM: CT ABDOMEN AND PELVIS WITHOUT CONTRAST TECHNIQUE: Multidetector CT imaging of the abdomen and pelvis was performed following the standard protocol without IV contrast. COMPARISON:  None. FINDINGS: Lower chest: No acute abnormality. Hepatobiliary: No focal liver abnormality is seen. No gallstones, gallbladder wall thickening, or biliary dilatation. Pancreas: Unremarkable. Spleen: Unremarkable. Adrenals/Urinary Tract: Adrenals are unremarkable. There is right hydronephrosis. Proximal right hydroureter. There is abrupt transition to decompressed ureter without apparent stone. Left kidney is unremarkable. Partially distended bladder is unremarkable. Stomach/Bowel: Stomach is within normal limits. Bowel is normal in caliber. Normal  appendix. Vascular/Lymphatic: No significant vascular abnormality on this noncontrast study. No enlarged lymph nodes. Reproductive: Unremarkable. Other: No free fluid.  Abdominal wall is unremarkable. 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 No acute osseous abnormality. IMPRESSION: Right hydronephrosis and proximal right hydroureter with abrupt transition to a decompressed ureter. No apparent stone. Recommend contrast enhanced urogram with delay to better evaluate the ureter. Electronically Signed   By: Guadlupe Spanish M.D.   On: 06/18/2021 12:54    Procedures Procedures   Medications Ordered in ED Medications  0.9 %  sodium chloride infusion (has no administration in time range)  acetaminophen (TYLENOL) tablet 650 mg ( Oral MAR Hold 06/18/21 1624)  Or  acetaminophen (TYLENOL) suppository 650 mg ( Rectal MAR Hold 06/18/21 1624)  ondansetron (ZOFRAN) tablet 4 mg ( Oral MAR Hold 06/18/21 1624)    Or  ondansetron (ZOFRAN) injection 4 mg ( Intravenous MAR Hold 06/18/21 1624)  senna-docusate (Senokot-S) tablet 1 tablet ( Oral MAR Hold 06/18/21 1624)  HYDROmorphone (DILAUDID) injection 0.5-1 mg ( Intravenous MAR Hold 06/18/21 1624)  cefTRIAXone (ROCEPHIN) 2 g in sodium chloride 0.9 % 100 mL IVPB ( Intravenous Automatically Held 06/27/21 1000)  tamsulosin (FLOMAX) capsule 0.4 mg ( Oral Automatically Held 06/26/21 1000)  lactated ringers infusion ( Intravenous Stopped 06/18/21 1745)  acetaminophen (TYLENOL) tablet 1,000 mg (1,000 mg Oral Given 06/18/21 0932)  morphine 4 MG/ML injection 4 mg (4 mg Intravenous Given 06/18/21 1224)  ondansetron (ZOFRAN) injection 4 mg (4 mg Intravenous Given 06/18/21 1224)  cefTRIAXone (ROCEPHIN) 2 g in sodium chloride 0.9 % 100 mL IVPB (0 g Intravenous Stopped 06/18/21 1442)  ketorolac (TORADOL) 30 MG/ML injection 30 mg (30 mg Intravenous Given 06/18/21 1408)  sodium chloride 0.9 % bolus 1,000 mL (0 mLs Intravenous Stopped  06/18/21 1451)  acetaminophen (TYLENOL) tablet 650 mg (650 mg Oral Given 06/18/21 1418)  sodium chloride 0.9 % bolus 1,000 mL (0 mLs Intravenous Stopped 06/18/21 1609)  chlorhexidine (PERIDEX) 0.12 % solution 15 mL (15 mLs Mouth/Throat Given 06/18/21 1634)    Or  MEDLINE mouth rinse ( Mouth Rinse See Alternative 06/18/21 1634)    ED Course  I have reviewed the triage vital signs and the nursing notes.  Pertinent labs & imaging results that were available during my care of the patient were reviewed by me and considered in my medical decision making (see chart for details).  Clinical Course as of 06/18/21 1808  Mon Jun 18, 2021  1317 Consulted urology - recommends medicine admission. Will evaluate CT scan and call back with recommendations however if no stone then pt does not require any surgical intervention [MV]    Clinical Course User Index [MV] Tanda Rockers, PA-C   MDM Rules/Calculators/A&P                           50 year old male who presents to the ED today with complaint of right flank pain, nausea, vomiting, fevers over the weekend.  History of kidney stones.  On arrival to the ED patient is febrile at 102.6 and tachycardic at 113.  Remainder vitals unremarkable.  He was noted to have right lower quadrant abdominal tenderness ovation as well as right CVA tenderness.  Work-up started with labs and imaging.  CBC has returned with a leukocytosis of 21,600.  It does appear that he was seen last week for pharyngitis and had a white blood cell count of 28,000 so it is downtrending.  CMP without elevated creatinine, 1.13 today which does appear to be patient's baseline.  Lactic acid within normal limits at 0.9.  Lipase 38.  Urinalysis does show greater than 50 red blood cells, greater than 50 white blood cells with leuks.  No nitrites.    CT renal stone study: Right hydronephrosis and proximal right hydroureter with abrupt  transition to a decompressed ureter. No apparent stone.  Recommend  contrast enhanced urogram with delay to better evaluate the ureter.      Started on antibiotics with concern for infected kidney stone. Urology currently viewing CT scan/speaking with radiologist. +/- stenting. Will admit to medicine  Prior to admission pt began complaining of being cold - he  was found to be shivering/in rigors. Rectal temp 102.2 and pt became tachycardic again. Additional tylenol and fluids provided at this time.   Discussed case with [facility] Dr. Chipper Herb who agrees to evaluate patient for admission.   Final Clinical Impression(s) / ED Diagnoses Final diagnoses:  Kidney stone  Lower urinary tract infectious disease    Rx / DC Orders ED Discharge Orders     None        Tanda Rockers, PA-C 06/18/21 1808    Melene Plan, DO 06/19/21 [phone]

## 2021-06-18 NOTE — Anesthesia Preprocedure Evaluation (Addendum)
Anesthesia Evaluation  Patient identified by MRN, date of birth, ID band Patient awake    Reviewed: Allergy & Precautions, H&P , NPO status , Patient's Chart, lab work & pertinent test results  Airway Mallampati: II  TM Distance: >3 FB Neck ROM: Full    Dental no notable dental hx. (+) Teeth Intact, Dental Advisory Given   Pulmonary neg pulmonary ROS,    Pulmonary exam normal breath sounds clear to auscultation       Cardiovascular negative cardio ROS   Rhythm:Regular Rate:Normal     Neuro/Psych negative neurological ROS  negative psych ROS   GI/Hepatic negative GI ROS, Neg liver ROS,   Endo/Other  negative endocrine ROS  Renal/GU Renal disease  negative genitourinary   Musculoskeletal   Abdominal   Peds  Hematology negative hematology ROS (+)   Anesthesia Other Findings   Reproductive/Obstetrics negative OB ROS                            Anesthesia Physical Anesthesia Plan  ASA: 1  Anesthesia Plan: General   Post-op Pain Management:    Induction: Intravenous  PONV Risk Score and Plan: 3 and Ondansetron, Dexamethasone and Midazolam  Airway Management Planned: LMA  Additional Equipment:   Intra-op Plan:   Post-operative Plan: Extubation in OR  Informed Consent: I have reviewed the patients History and Physical, chart, labs and discussed the procedure including the risks, benefits and alternatives for the proposed anesthesia with the patient or authorized representative who has indicated his/her understanding and acceptance.     Dental advisory given  Plan Discussed with: CRNA  Anesthesia Plan Comments:        Anesthesia Quick Evaluation

## 2021-06-18 NOTE — Op Note (Signed)
Preoperative diagnosis:  Right hydronephrosis  Sepsis secondary to UTI  Postoperative diagnosis:  Right hydronephrosis  Sepsis secondary to UTI  Procedure:  Cystoscopy right ureteral stent placement right retrograde pyelography with interpretation   Surgeon: Kasandra Knudsen, MD  Anesthesia: General  Complications: None  Intraoperative findings:   1.  Normal anterior urethra 2.  Normal prostatic urethra 3.  Bilateral orthotopic ureteral orifices; purulent E flux from right ureteral orifice 4.  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 patient who has history of kidney stones who presented to ED with right flank pain.  Patient with evidence of urinary tract infection and associated sepsis.  CT scan showed right proximal hydronephrosis without clear obstructing stone.  After reviewing the management options for treatment, he elected to proceed with the above surgical procedure(s). We have discussed the potential benefits and risks of the procedure, side effects of the proposed treatment, the likelihood of the patient achieving the goals of the procedure, and any potential problems that might occur during the procedure or recuperation. Informed consent has been obtained.  Description of procedure:  The patient was taken to the operating room and general anesthesia was induced.  The patient was placed in the dorsal lithotomy position, prepped and draped in the usual sterile fashion, and preoperative antibiotics were administered. A preoperative time-out was performed.   Cystourethroscopy was performed.  The patient's urethra was examined and  demonstrated bilobar prostatic hypertrophy . The bladder was then systematically examined in its entirety.  There is scattered patchy erythema in the bladder concerning for acute infection as well as possibility of bladder neoplasm.  Attention then turned to the right ureteral orifice and a ureteral catheter was used to intubate the ureteral orifice.  Omnipaque contrast was injected through the ureteral catheter and a retrograde pyelogram was performed with findings as dictated above.  A 0.38 sensor guidewire was then advanced up the right ureter into the renal pelvis under fluoroscopic guidance.  The wire was then backloaded through the cystoscope and a ureteral stent was advance over the wire using Seldinger technique.  The stent was positioned appropriately under fluoroscopic and cystoscopic guidance.  The wire was then removed with an adequate stent curl noted in the renal pelvis as well as in the bladder.  The bladder was then emptied and the procedure ended.  The patient appeared to tolerate the procedure well and without complications.  The patient was able to be awakened and transferred to the recovery unit in satisfactory condition.    Plan: Patient will be admitted and monitored for worsening infection.  He required pressors intraoperatively.  He remains on ceftriaxone and Foley catheter stay in place for maximal drainage.  Patient will likely need imaging with contrast to better assess ureter and also diagnostic right ureteroscopy after infection is treated.  Kasandra Knudsen, M.D.

## 2021-06-19 ENCOUNTER — Encounter (HOSPITAL_COMMUNITY): Payer: Self-pay | Admitting: Urology

## 2021-06-19 DIAGNOSIS — N390 Urinary tract infection, site not specified: Secondary | ICD-10-CM

## 2021-06-19 LAB — BASIC METABOLIC PANEL
Anion gap: 6 (ref 5–15)
BUN: 10 mg/dL (ref 6–20)
CO2: 22 mmol/L (ref 22–32)
Calcium: 7.7 mg/dL — ABNORMAL LOW (ref 8.9–10.3)
Chloride: 111 mmol/L (ref 98–111)
Creatinine, Ser: 1.16 mg/dL (ref 0.61–1.24)
GFR, Estimated: >60 mL/min (ref >60)
Glucose, Bld: 104 mg/dL — ABNORMAL HIGH (ref 70–99)
Potassium: 4.1 mmol/L (ref 3.5–5.1)
Sodium: 139 mmol/L (ref 135–145)

## 2021-06-19 LAB — CBC
HCT: 31.1 % — ABNORMAL LOW (ref 39.0–52.0)
Hemoglobin: 10.9 g/dL — ABNORMAL LOW (ref 13.0–17.0)
MCH: 32.6 pg (ref 26.0–34.0)
MCHC: 35 g/dL (ref 30.0–36.0)
MCV: 93.1 fL (ref 80.0–100.0)
Platelets: 277 10*3/uL (ref 150–400)
RBC: 3.34 MIL/uL — ABNORMAL LOW (ref 4.22–5.81)
RDW: 13.2 % (ref 11.5–15.5)
WBC: 28.6 10*3/uL — ABNORMAL HIGH (ref 4.0–10.5)
nRBC: 0 % (ref 0.0–0.2)

## 2021-06-19 MED ORDER — HYDROMORPHONE HCL 1 MG/ML IJ SOLN: 0.5000 mg | INTRAMUSCULAR | Status: DC | PRN

## 2021-06-19 MED ORDER — CHLORHEXIDINE GLUCONATE CLOTH 2 % EX PADS
6.0000 | MEDICATED_PAD | Freq: Every day | CUTANEOUS | Status: DC
  Administered 2021-06-19 – 2021-06-20 (×2): 6 via TOPICAL

## 2021-06-19 MED ORDER — SODIUM CHLORIDE 0.9 % IV SOLN: INTRAVENOUS | Status: DC

## 2021-06-19 MED ORDER — ENOXAPARIN SODIUM 40 MG/0.4ML IJ SOSY
40.0000 mg | PREFILLED_SYRINGE | Freq: Every evening | INTRAMUSCULAR | Status: DC
  Administered 2021-06-19: 40 mg via SUBCUTANEOUS
  Filled 2021-06-19: qty 0.4

## 2021-06-19 MED ORDER — HYDROCODONE-ACETAMINOPHEN 5-325 MG PO TABS: 1.0000 | ORAL_TABLET | ORAL | Status: DC | PRN

## 2021-06-19 NOTE — Progress Notes (Signed)
PROGRESS NOTE    Zhi Geier  SWF:[id number] DOB: June 14, 1971 DOA: 06/18/2021 PCP: Pcp, No    Chief Complaint  Patient presents with   Abdominal Pain   Nausea   Emesis    Brief Narrative:    Justn Quale is a 50 y.o. male with medical history significant of recurrent kidney stones, presented with new onset of right-sided flank pain, nauseous vomiting and hematuria.   Symptoms started last Friday, initially was cramping-like right sided/flank pain radiated to the right groin, yesterday, symptoms became severe, 10/10, associated with nauseous vomit of stomach content, and episode of fever and chills.  Last night, started to have trouble urinate, "Had to strain to pee" and each time only small amount of urine,.  Then suddenly, started to pass large amount of hematuria, bright red color, several times last night.  The pain appears to improved this morning, 4/10, still having fever and chills.  Still feel nauseous but no more vomiting.   ED Course: Fever 102.6, blood pressure stable, tachycardia.  Blood work leukocytosis 21.6, CT renal stone possible right-sided hydronephrosis and hydroureter but no significant obstructions.  Assessment & Plan:   Active Problems:   Sepsis (HCC)  Sepsis secondary to complicated UTI and right-sided pyelonephritis/Right-sided obstructive uropathy  -Sepsis present on admission, evidenced by fever, tachycardia and leukocytosis, he required brief pressor support initially. -Continue ceftriaxone 2 g daily, culture showing E. coli, blood cultures is pending -on  Flomax. -Treating with IV fluids, continue with pain control. -Urology input greatly appreciated, status post cystoscopy, right ureteral stent placement, right retrograde pyelography with interpretation, Foley catheter, to stay in place for maximal drainage, patient likely will need imaging with contrast to better assess ureter and also diagnostic right ureteroscopy, timing.   Urology,   DVT prophylaxis: Lovenox Code Status: Full Family Communication: This with wife, multiple family members at bedside via video interpreter Disposition:   Status is: Inpatient  Remains inpatient appropriate because:IV treatments appropriate due to intensity of illness or inability to take PO  Dispo: The patient is from: Home              Anticipated d/c is to: Home              Patient currently is not medically stable to d/c.   Difficult to place patient No       Consultants:  Urology  Procedures:     Procedure:   Cystoscopy right ureteral stent placement right retrograde pyelography with interpretation    Surgeon: Kasandra Knudsen, MD  Subjective: This morning reports he is hungry, asking if he can have breakfast, reports some mild right flank pain. Objective: Vitals:   06/18/21 1930 06/18/21 1945 06/18/21 2000 06/19/21 0750  BP: (!) 87/60 (!) 82/60 (!) 85/60 101/68  Pulse: (!) 108 (!) 107 (!) 108 (!) 107  Resp: 16 16 17    Temp:  98.3 F (36.8 C) 98.3 F (36.8 C) 99.2 F (37.3 C)  TempSrc:   Axillary   SpO2: 94% 94% 95% 96%  Weight:   79 kg     Intake/Output Summary (Last 24 hours) at 06/19/2021 1434 Last data filed at 06/19/2021 1301 Gross per 24 hour  Intake 3100 ml  Output 1075 ml  Net 2025 ml   Filed Weights   06/18/21 1641 06/18/21 2000  Weight: 70.2 kg 79 kg    Examination:  Awake Alert, Oriented X 3, No new F.N deficits, Normal affect Symmetrical Chest wall movement, Good air movement bilaterally, CTAB  RRR,No Gallops,Rubs or new Murmurs, No Parasternal Heave +ve B.Sounds, Abd Soft, right flank/CVA tenderness No Cyanosis, Clubbing or edema, No new Rash or bruise      Data Reviewed: I have personally reviewed following labs and imaging studies  CBC: Recent Labs  Lab 06/18/21 0927 06/19/21 0235  WBC 21.6* 28.6*  NEUTROABS 19.7*  --   HGB 13.2 10.9*  HCT 39.0 31.1*  MCV 94.0 93.1  PLT 365 277    Basic Metabolic  Panel: Recent Labs  Lab 06/18/21 0927 06/19/21 0235  NA 135 139  K 3.8 4.1  CL 103 111  CO2 21* 22  GLUCOSE 102* 104*  BUN 15 10  CREATININE 1.13 1.16  CALCIUM 9.0 7.7*    GFR: Estimated Creatinine Clearance: 76.3 mL/min (by C-G formula based on SCr of 1.16 mg/dL).  Liver Function Tests: Recent Labs  Lab 06/18/21 0927  AST 20  ALT 29  ALKPHOS 50  BILITOT 1.1  PROT 7.0  ALBUMIN 3.5    CBG: No results for input(s): GLUCAP in the last 168 hours.   Recent Results (from the past 240 hour(s))  Urine Culture     Status: Abnormal (Preliminary result)   Collection Time: 06/18/21 12:18 PM   Specimen: Urine, Clean Catch  Result Value Ref Range Status   Specimen Description URINE, CLEAN CATCH  Final   Special Requests NONE  Final   Culture (A)  Final    >=100,000 COLONIES/mL ENTEROBACTER AEROGENES SUSCEPTIBILITIES TO FOLLOW Performed at [facility], [address] [place], [place] [address]    Report Status PENDING  Incomplete  Resp Panel by RT-PCR (Flu A&B, Covid) Urine, Clean Catch     Status: None   Collection Time: 06/18/21  1:05 PM   Specimen: Urine, Clean Catch; Nasopharyngeal(NP) swabs in vial transport medium  Result Value Ref Range Status   SARS Coronavirus 2 by RT PCR NEGATIVE NEGATIVE Final    Comment: (NOTE) SARS-CoV-2 target nucleic acids are NOT DETECTED.  The SARS-CoV-2 RNA is generally detectable in upper respiratory specimens during the acute phase of infection. The lowest concentration of SARS-CoV-2 viral copies this assay can detect is 138 copies/mL. A negative result does not preclude SARS-Cov-2 infection and should not be used as the sole basis for treatment or other patient management decisions. A negative result may occur with  improper specimen collection/handling, submission of specimen other than nasopharyngeal swab, presence of viral mutation(s) within the areas targeted by this assay, and inadequate number of viral copies(<138  copies/mL). A negative result must be combined with clinical observations, patient history, and epidemiological information. The expected result is Negative.  Fact Sheet for Patients:  BloggerCourse.com  Fact Sheet for Healthcare Providers:  SeriousBroker.it  This test is no t yet approved or cleared by the [place] FDA and  has been authorized for detection and/or diagnosis of SARS-CoV-2 by FDA under an Emergency Use Authorization (EUA). This EUA will remain  in effect (meaning this test can be used) for the duration of the COVID-19 declaration under Section 564(b)(1) of the Act, 21 U.S.C.section 360bbb-3(b)(1), unless the authorization is terminated  or revoked sooner.       Influenza A by PCR NEGATIVE NEGATIVE Final   Influenza B by PCR NEGATIVE NEGATIVE Final    Comment: (NOTE) The Xpert Xpress SARS-CoV-2/FLU/RSV plus assay is intended as an aid in the diagnosis of influenza from Nasopharyngeal swab specimens and should not be used as a sole basis for treatment. Nasal washings and  aspirates are unacceptable for Xpert Xpress SARS-CoV-2/FLU/RSV testing.  Fact Sheet for Patients: BloggerCourse.com  Fact Sheet for Healthcare Providers: SeriousBroker.it  This test is not yet approved or cleared by the [place] FDA and has been authorized for detection and/or diagnosis of SARS-CoV-2 by FDA under an Emergency Use Authorization (EUA). This EUA will remain in effect (meaning this test can be used) for the duration of the COVID-19 declaration under Section 564(b)(1) of the Act, 21 U.S.C. section 360bbb-3(b)(1), unless the authorization is terminated or revoked.  Performed at [facility], [address], [place], [place] [address]   Culture, blood (routine x 2)     Status: None (Preliminary result)   Collection Time: 06/18/21  8:52 PM   Specimen: BLOOD  Result  Value Ref Range Status   Specimen Description BLOOD SITE NOT SPECIFIED  Final   Special Requests   Final    BOTTLES DRAWN AEROBIC ONLY Blood Culture results may not be optimal due to an inadequate volume of blood received in culture bottles   Culture   Final    NO GROWTH < 12 HOURS Performed at [facility], [address], [place], [place] [address]    Report Status PENDING  Incomplete  Culture, blood (routine x 2)     Status: None (Preliminary result)   Collection Time: 06/18/21  8:52 PM   Specimen: BLOOD  Result Value Ref Range Status   Specimen Description BLOOD SITE NOT SPECIFIED  Final   Special Requests   Final    BOTTLES DRAWN AEROBIC ONLY Blood Culture results may not be optimal due to an inadequate volume of blood received in culture bottles   Culture   Final    NO GROWTH < 12 HOURS Performed at [facility], [address], [place], [place] [address]    Report Status PENDING  Incomplete         Radiology Studies: DG C-Arm 1-60 Min  Result Date: 06/18/2021 CLINICAL DATA:  Surgery. Cystoscopy with retrograde pyelogram, ureteroscopy, and stent placement. EXAM: DG C-ARM 1-60 MIN COMPARISON:  CT 06/18/2021 FINDINGS: Intraoperative fluoroscopy is utilized for surgical control purposes during cystoscopic procedure. Fluoroscopy time is recorded at 21.1 seconds. Dose 3.55 mGy. Two spot fluoroscopic images are obtained. Images obtained demonstrate retrograde injection of contrast material into the right ureter. There is a filling defect at the ureteropelvic junction with proximal hydronephrosis in the right kidney. The second image demonstrates placement of a right ureteral stent with partial drainage of the contrast material. IMPRESSION: Intraoperative fluoroscopy utilized for surgical control purposes during right ureteral stent placement. Electronically Signed   By: Burman Nieves M.D.   On: 06/18/2021 19:11   CT Renal Stone Study  Result Date:  06/18/2021 CLINICAL DATA:  Flank pain, kidney stone suspected EXAM: CT ABDOMEN AND PELVIS WITHOUT CONTRAST TECHNIQUE: Multidetector CT imaging of the abdomen and pelvis was performed following the standard protocol without IV contrast. COMPARISON:  None. FINDINGS: Lower chest: No acute abnormality. Hepatobiliary: No focal liver abnormality is seen. No gallstones, gallbladder wall thickening, or biliary dilatation. Pancreas: Unremarkable. Spleen: Unremarkable. Adrenals/Urinary Tract: Adrenals are unremarkable. There is right hydronephrosis. Proximal right hydroureter. There is abrupt transition to decompressed ureter without apparent stone. Left kidney is unremarkable. Partially distended bladder is unremarkable. Stomach/Bowel: Stomach is within normal limits. Bowel is normal in caliber. Normal appendix. Vascular/Lymphatic: No significant vascular abnormality on this noncontrast study. No enlarged lymph nodes. Reproductive: Unremarkable. Other: No free fluid.  Abdominal wall  is unremarkable. 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 No acute osseous abnormality. IMPRESSION: Right hydronephrosis and proximal right hydroureter with abrupt transition to a decompressed ureter. No apparent stone. Recommend contrast enhanced urogram with delay to better evaluate the ureter. Electronically Signed   By: Guadlupe Spanish M.D.   On: 06/18/2021 12:54        Scheduled Meds:  tamsulosin  0.4 mg Oral Daily   Continuous Infusions:  sodium chloride 150 mL/hr at 06/19/21 1351   cefTRIAXone (ROCEPHIN)  IV 2 g (06/19/21 1031)     LOS: 1 day       Huey Bienenstock, MD [facility]   To contact the attending provider between 7A-7P or the covering provider during after hours 7P-7A, please log into the web site [email or web address] and access using universal [place] password for that web site. If you do not have the password, please call the hospital  operator.  06/19/2021, 2:34 PM

## 2021-06-20 LAB — BASIC METABOLIC PANEL
Anion gap: 5 (ref 5–15)
BUN: <5 mg/dL — ABNORMAL LOW (ref 6–20)
CO2: 24 mmol/L (ref 22–32)
Calcium: 8 mg/dL — ABNORMAL LOW (ref 8.9–10.3)
Chloride: 106 mmol/L (ref 98–111)
Creatinine, Ser: 1.01 mg/dL (ref 0.61–1.24)
GFR, Estimated: >60 mL/min (ref >60)
Glucose, Bld: 115 mg/dL — ABNORMAL HIGH (ref 70–99)
Potassium: 3.2 mmol/L — ABNORMAL LOW (ref 3.5–5.1)
Sodium: 135 mmol/L (ref 135–145)

## 2021-06-20 LAB — CBC
HCT: 29.2 % — ABNORMAL LOW (ref 39.0–52.0)
Hemoglobin: 10 g/dL — ABNORMAL LOW (ref 13.0–17.0)
MCH: 31.8 pg (ref 26.0–34.0)
MCHC: 34.2 g/dL (ref 30.0–36.0)
MCV: 93 fL (ref 80.0–100.0)
Platelets: 282 10*3/uL (ref 150–400)
RBC: 3.14 MIL/uL — ABNORMAL LOW (ref 4.22–5.81)
RDW: 13.2 % (ref 11.5–15.5)
WBC: 14 10*3/uL — ABNORMAL HIGH (ref 4.0–10.5)
nRBC: 0 % (ref 0.0–0.2)

## 2021-06-20 LAB — URINE CULTURE: Culture: >=100000 — AB

## 2021-06-20 MED ORDER — TAMSULOSIN HCL 0.4 MG PO CAPS: 0.4000 mg | ORAL_CAPSULE | Freq: Every day | ORAL | 1 refills | Status: DC

## 2021-06-20 MED ORDER — SENNOSIDES-DOCUSATE SODIUM 8.6-50 MG PO TABS: 1.0000 | ORAL_TABLET | Freq: Every evening | ORAL | 0 refills | Status: DC | PRN

## 2021-06-20 MED ORDER — SULFAMETHOXAZOLE-TRIMETHOPRIM 800-160 MG PO TABS: 1.0000 | ORAL_TABLET | Freq: Two times a day (BID) | ORAL | 0 refills | Status: DC

## 2021-06-20 MED ORDER — SULFAMETHOXAZOLE-TRIMETHOPRIM 800-160 MG PO TABS
1.0000 | ORAL_TABLET | Freq: Two times a day (BID) | ORAL | Status: DC
  Administered 2021-06-20: 1 via ORAL
  Filled 2021-06-20: qty 1

## 2021-06-20 MED ORDER — HYDROCODONE-ACETAMINOPHEN 5-325 MG PO TABS: 1.0000 | ORAL_TABLET | ORAL | 0 refills | Status: DC | PRN

## 2021-06-20 NOTE — Progress Notes (Signed)
AVS provided to patient. All questions answered at this time. PT discharged with family.   Foley catheter removed per order.  PT voided with no complications.

## 2021-06-20 NOTE — Discharge Summary (Signed)
Physician Discharge Summary  Jordan Patrick NTI:[id number] DOB: [birth year] DOA: 06/18/2021  PCP: Pcp, No  Admit date: 06/18/2021 Discharge date: 06/20/2021  Time spent: 60 minutes  Recommendations for Outpatient Follow-up:  Follow-up with urology in 2 weeks Continue Bactrim DS 1 tablet p.o. twice daily for 10 more days   Discharge Diagnoses:  Active Problems:   Sepsis [facility])   Discharge Condition: Stable  Diet recommendation: Regular diet  Filed Weights   06/18/21 1641 06/18/21 2000  Weight: 70.2 kg 79 kg    History of present illness:  50 y.o. male with medical history significant of recurrent kidney stones, presented with new onset of right-sided flank pain, nauseous vomiting and hematuria.   Symptoms started last Friday, initially was cramping-like right sided/flank pain radiated to the right groin, yesterday, symptoms became severe, 10/10, associated with nauseous vomit of stomach content, and episode of fever and chills.  Last night, started to have trouble urinate, "Had to strain to pee" and each time only small amount of urine,.  Then suddenly, started to pass large amount of hematuria, bright red color, several times last night.     ED Course: Fever 102.6, blood pressure stable, tachycardia.  Blood work leukocytosis 21.6, CT renal stone possible right-sided hydronephrosis and hydroureter but no significant obstructions  Hospital Course:   Sepsis secondary to complicated UTI -Resolved -Patient presented with fever, tachycardia, leukocytosis -Started on ceftriaxone 2 g IV daily -Started on Flomax -He was seen by urology, s/p cystoscopy, right ureteral stent placement, right retrograde pyelography -Foley catheter has been removed as per urology recommendation -Urine culture grew Enterobacter aerogenes; sensitive to Bactrim DS 1 tablet p.o. twice daily -We will discharge on Bactrim DS 1 tablet p.o. twice daily for 10 more days -Patient to follow-up with urology  to address and schedule diagnostic ureteroscopy  Procedures: Cystoscopy, right ureteral stent placement, right retrograde pyelogram 3  Consultations: Urology  Discharge Exam: Vitals:   06/20/21 0326 06/20/21 1158  BP: 136/78 104/70  Pulse: 100 75  Resp: 20   Temp: 98.9 F (37.2 C) 98.3 F (36.8 C)  SpO2: 100% 97%    General: Appears in no acute distress Cardiovascular: S1-S2, regular, no murmur auscultated Respiratory: Clear to auscultation bilaterally  Discharge Instructions   Discharge Instructions     Diet - low sodium heart healthy   Complete by: As directed    Increase activity slowly   Complete by: As directed       Allergies as of 06/20/2021   No Known Allergies      Medication List     STOP taking these medications    acetaminophen 500 MG tablet Commonly known as: TYLENOL   cephALEXin 250 MG capsule Commonly known as: KEFLEX   HYDROcodone-acetaminophen 5-500 MG tablet Commonly known as: VICODIN Replaced by: HYDROcodone-acetaminophen 5-325 MG tablet   methocarbamol 500 MG tablet Commonly known as: ROBAXIN   naproxen 500 MG tablet Commonly known as: NAPROSYN   oxyCODONE-acetaminophen 5-325 MG tablet Commonly known as: Percocet       TAKE these medications    HYDROcodone-acetaminophen 5-325 MG tablet Commonly known as: NORCO/VICODIN Take 1 tablet by mouth every 4 (four) hours as needed for severe pain or moderate pain. Replaces: HYDROcodone-acetaminophen 5-500 MG tablet   senna-docusate 8.6-50 MG tablet Commonly known as: Senokot-S Take 1 tablet by mouth at bedtime as needed for mild constipation.   sulfamethoxazole-trimethoprim 800-160 MG tablet Commonly known as: BACTRIM DS Take 1 tablet by mouth every 12 (twelve) hours.  tamsulosin 0.4 MG Caps capsule Commonly known as: FLOMAX Take 1 capsule (0.4 mg total) by mouth daily. Start taking on: June 21, 2021       No Known Allergies  Follow-up Information     Kasandra Knudsen D, MD. Schedule an appointment as soon as possible for a visit in 2 week(s).   Specialty: Urology Contact information: [address] [place] [address] [place] [address] [phone]                  The results of significant diagnostics from this hospitalization (including imaging, microbiology, ancillary and laboratory) are listed below for reference.    Significant Diagnostic Studies: CT Soft Tissue Neck W Contrast  Result Date: 06/07/2021 CLINICAL DATA:  Sore throat EXAM: CT NECK WITH CONTRAST TECHNIQUE: Multidetector CT imaging of the neck was performed using the standard protocol following the bolus administration of intravenous contrast. CONTRAST:  77mL OMNIPAQUE IOHEXOL 350 MG/ML SOLN COMPARISON:  None. FINDINGS: PHARYNX AND LARYNX: There is enlargement of the palatine tonsils, right-greater-than-left. No peritonsillar or retropharyngeal abscess. The epiglottis is normal. Mild enlargement of the lingual tonsils. No airway narrowing. SALIVARY GLANDS: Normal parotid, submandibular and sublingual glands. THYROID: Normal. LYMPH NODES: Bilateral reactive cervical lymphadenopathy. VASCULAR: Major cervical vessels are patent. LIMITED INTRACRANIAL: Normal. VISUALIZED ORBITS: Normal. MASTOIDS AND VISUALIZED PARANASAL SINUSES: No fluid levels or advanced mucosal thickening. No mastoid effusion. SKELETON: No bony spinal canal stenosis. No lytic or blastic lesions. UPPER CHEST: Clear. OTHER: None. IMPRESSION: 1. Acute tonsillopharyngitis without peritonsillar or retropharyngeal abscess. 2. Reactive cervical lymphadenopathy. Electronically Signed   By: Deatra Robinson M.D.   On: 06/07/2021 00:31   DG C-Arm 1-60 Min  Result Date: 06/18/2021 CLINICAL DATA:  Surgery. Cystoscopy with retrograde pyelogram, ureteroscopy, and stent placement. EXAM: DG C-ARM 1-60 MIN COMPARISON:  CT 06/18/2021 FINDINGS: Intraoperative fluoroscopy is utilized for surgical control purposes during cystoscopic  procedure. Fluoroscopy time is recorded at 21.1 seconds. Dose 3.55 mGy. Two spot fluoroscopic images are obtained. Images obtained demonstrate retrograde injection of contrast material into the right ureter. There is a filling defect at the ureteropelvic junction with proximal hydronephrosis in the right kidney. The second image demonstrates placement of a right ureteral stent with partial drainage of the contrast material. IMPRESSION: Intraoperative fluoroscopy utilized for surgical control purposes during right ureteral stent placement. Electronically Signed   By: Burman Nieves M.D.   On: 06/18/2021 19:11   CT Renal Stone Study  Result Date: 06/18/2021 CLINICAL DATA:  Flank pain, kidney stone suspected EXAM: CT ABDOMEN AND PELVIS WITHOUT CONTRAST TECHNIQUE: Multidetector CT imaging of the abdomen and pelvis was performed following the standard protocol without IV contrast. COMPARISON:  None. FINDINGS: Lower chest: No acute abnormality. Hepatobiliary: No focal liver abnormality is seen. No gallstones, gallbladder wall thickening, or biliary dilatation. Pancreas: Unremarkable. Spleen: Unremarkable. Adrenals/Urinary Tract: Adrenals are unremarkable. There is right hydronephrosis. Proximal right hydroureter. There is abrupt transition to decompressed ureter without apparent stone. Left kidney is unremarkable. Partially distended bladder is unremarkable. Stomach/Bowel: Stomach is within normal limits. Bowel is normal in caliber. Normal appendix. Vascular/Lymphatic: No significant vascular abnormality on this noncontrast study. No enlarged lymph nodes. Reproductive: Unremarkable. Other: No free fluid.  Abdominal wall is unremarkable. 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 No acute osseous abnormality. IMPRESSION: Right hydronephrosis and proximal right hydroureter with abrupt transition to a decompressed ureter. No apparent stone. Recommend contrast enhanced urogram  with delay to better evaluate the ureter. Electronically Signed  By: Guadlupe Spanish M.D.   On: 06/18/2021 12:54    Microbiology: Recent Results (from the past 240 hour(s))  Urine Culture     Status: Abnormal   Collection Time: 06/18/21 12:18 PM   Specimen: Urine, Clean Catch  Result Value Ref Range Status   Specimen Description URINE, CLEAN CATCH  Final   Special Requests   Final    NONE Performed at [facility], [address], [place], [place] [address]    Culture >=100,000 COLONIES/mL ENTEROBACTER AEROGENES (A)  Final   Report Status 06/20/2021 FINAL  Final   Organism ID, Bacteria ENTEROBACTER AEROGENES (A)  Final      Susceptibility   Enterobacter aerogenes - MIC*    CEFAZOLIN >=64 RESISTANT Resistant     CEFEPIME <=0.12 SENSITIVE Sensitive     CEFTRIAXONE <=0.25 SENSITIVE Sensitive     CIPROFLOXACIN <=0.25 SENSITIVE Sensitive     GENTAMICIN <=1 SENSITIVE Sensitive     IMIPENEM 1 SENSITIVE Sensitive     NITROFURANTOIN 64 INTERMEDIATE Intermediate     TRIMETH/SULFA <=20 SENSITIVE Sensitive     PIP/TAZO <=4 SENSITIVE Sensitive     * >=100,000 COLONIES/mL ENTEROBACTER AEROGENES  Resp Panel by RT-PCR (Flu A&B, Covid) Urine, Clean Catch     Status: None   Collection Time: 06/18/21  1:05 PM   Specimen: Urine, Clean Catch; Nasopharyngeal(NP) swabs in vial transport medium  Result Value Ref Range Status   SARS Coronavirus 2 by RT PCR NEGATIVE NEGATIVE Final    Comment: (NOTE) SARS-CoV-2 target nucleic acids are NOT DETECTED.  The SARS-CoV-2 RNA is generally detectable in upper respiratory specimens during the acute phase of infection. The lowest concentration of SARS-CoV-2 viral copies this assay can detect is 138 copies/mL. A negative result does not preclude SARS-Cov-2 infection and should not be used as the sole basis for treatment or other patient management decisions. A negative result may occur with  improper specimen collection/handling, submission of specimen  other than nasopharyngeal swab, presence of viral mutation(s) within the areas targeted by this assay, and inadequate number of viral copies(<138 copies/mL). A negative result must be combined with clinical observations, patient history, and epidemiological information. The expected result is Negative.  Fact Sheet for Patients:  BloggerCourse.com  Fact Sheet for Healthcare Providers:  SeriousBroker.it  This test is no t yet approved or cleared by the [place] FDA and  has been authorized for detection and/or diagnosis of SARS-CoV-2 by FDA under an Emergency Use Authorization (EUA). This EUA will remain  in effect (meaning this test can be used) for the duration of the COVID-19 declaration under Section 564(b)(1) of the Act, 21 U.S.C.section 360bbb-3(b)(1), unless the authorization is terminated  or revoked sooner.       Influenza A by PCR NEGATIVE NEGATIVE Final   Influenza B by PCR NEGATIVE NEGATIVE Final    Comment: (NOTE) The Xpert Xpress SARS-CoV-2/FLU/RSV plus assay is intended as an aid in the diagnosis of influenza from Nasopharyngeal swab specimens and should not be used as a sole basis for treatment. Nasal washings and aspirates are unacceptable for Xpert Xpress SARS-CoV-2/FLU/RSV testing.  Fact Sheet for Patients: BloggerCourse.com  Fact Sheet for Healthcare Providers: SeriousBroker.it  This test is not yet approved or cleared by the [place] FDA and has been authorized for detection and/or diagnosis of SARS-CoV-2 by FDA under an Emergency Use Authorization (EUA). This EUA will remain in effect (meaning this test can be used) for the duration of the COVID-19 declaration under  Section 564(b)(1) of the Act, 21 U.S.C. section 360bbb-3(b)(1), unless the authorization is terminated or revoked.  Performed at [facility], [address] [place],  [place] [address]   Culture, blood (routine x 2)     Status: None (Preliminary result)   Collection Time: 06/18/21  8:52 PM   Specimen: BLOOD  Result Value Ref Range Status   Specimen Description BLOOD SITE NOT SPECIFIED  Final   Special Requests   Final    BOTTLES DRAWN AEROBIC ONLY Blood Culture results may not be optimal due to an inadequate volume of blood received in culture bottles   Culture   Final    NO GROWTH 2 DAYS Performed at [facility], [address] [place], [place] [address]    Report Status PENDING  Incomplete  Culture, blood (routine x 2)     Status: None (Preliminary result)   Collection Time: 06/18/21  8:52 PM   Specimen: BLOOD  Result Value Ref Range Status   Specimen Description BLOOD SITE NOT SPECIFIED  Final   Special Requests   Final    BOTTLES DRAWN AEROBIC ONLY Blood Culture results may not be optimal due to an inadequate volume of blood received in culture bottles   Culture   Final    NO GROWTH 2 DAYS Performed at [facility], [address] [place], [place] [address]    Report Status PENDING  Incomplete     Labs: Basic Metabolic Panel: Recent Labs  Lab 06/18/21 0927 06/19/21 0235 06/20/21 0159  NA 135 139 135  K 3.8 4.1 3.2*  CL 103 111 106  CO2 21* 22 24  GLUCOSE 102* 104* 115*  BUN 15 10 <5*  CREATININE 1.13 1.16 1.01  CALCIUM 9.0 7.7* 8.0*   Liver Function Tests: Recent Labs  Lab 06/18/21 0927  AST 20  ALT 29  ALKPHOS 50  BILITOT 1.1  PROT 7.0  ALBUMIN 3.5   Recent Labs  Lab 06/18/21 0927  LIPASE 38   No results for input(s): AMMONIA in the last 168 hours. CBC: Recent Labs  Lab 06/18/21 0927 06/19/21 0235 06/20/21 0159  WBC 21.6* 28.6* 14.0*  NEUTROABS 19.7*  --   --   HGB 13.2 10.9* 10.0*  HCT 39.0 31.1* 29.2*  MCV 94.0 93.1 93.0  PLT 365 277 282        Signed:  Meredeth Ide MD.  [facility] 06/20/2021, 3:32 PM

## 2021-06-20 NOTE — Progress Notes (Signed)
Urology Inpatient Progress Report   S: No overnight events.   Active Problems:   Sepsis (HCC)  Current Facility-Administered Medications  Medication Dose Route Frequency Provider Last Rate Last Admin   0.9 %  sodium chloride infusion   Intravenous Continuous Elgergawy, Leana Roe, MD 100 mL/hr at 06/19/21 2325 New Bag at 06/19/21 2325   acetaminophen (TYLENOL) tablet 650 mg  650 mg Oral Q6H PRN Emeline General, MD   650 mg at 06/20/21 4132   Or   acetaminophen (TYLENOL) suppository 650 mg  650 mg Rectal Q6H PRN Mikey College T, MD       cefTRIAXone (ROCEPHIN) 2 g in sodium chloride 0.9 % 100 mL IVPB  2 g Intravenous Q24H Mikey College T, MD 200 mL/hr at 06/19/21 1031 2 g at 06/19/21 1031   Chlorhexidine Gluconate Cloth 2 % PADS 6 each  6 each Topical Daily Elgergawy, Leana Roe, MD   6 each at 06/19/21 2253   enoxaparin (LOVENOX) injection 40 mg  40 mg Subcutaneous QPM Elgergawy, Leana Roe, MD   40 mg at 06/19/21 1806   HYDROcodone-acetaminophen (NORCO/VICODIN) 5-325 MG per tablet 1 tablet  1 tablet Oral Q4H PRN Elgergawy, Leana Roe, MD       HYDROmorphone (DILAUDID) injection 0.5 mg  0.5 mg Intravenous Q4H PRN Elgergawy, Leana Roe, MD       ondansetron (ZOFRAN) tablet 4 mg  4 mg Oral Q6H PRN Emeline General, MD       Or   ondansetron [facility]) injection 4 mg  4 mg Intravenous Q6H PRN Emeline General, MD       senna-docusate (Senokot-S) tablet 1 tablet  1 tablet Oral QHS PRN Mikey College T, MD       tamsulosin [facility]) capsule 0.4 mg  0.4 mg Oral Daily Mikey College T, MD   0.4 mg at 06/20/21 0948     Objective: Vital: Vitals:   06/19/21 0750 06/19/21 1603 06/19/21 1946 06/20/21 0326  BP: 101/68 138/76 126/72 136/78  Pulse: (!) 107 100 100 100  Resp:   20 20  Temp: 99.2 F (37.3 C) 99.3 F (37.4 C) (!) 100.9 F (38.3 C) 98.9 F (37.2 C)  TempSrc:  Oral Oral Oral  SpO2: 96% 100% 97% 100%  Weight:       I/Os: I/O last 3 completed shifts: In: -  Out: 3800 [Urine:3800]  Physical Exam:   General: Patient is in no apparent distress Lungs: Normal respiratory effort, chest expands symmetrically. GI: The abdomen is soft. Mild RLQ ttp, Mild right CVA ttp Foley:  16Fr foley draining clear yellow urine Ext: lower extremities symmetric  Lab Results: Recent Labs    06/18/21 0927 06/19/21 0235 06/20/21 0159  WBC 21.6* 28.6* 14.0*  HGB 13.2 10.9* 10.0*  HCT 39.0 31.1* 29.2*   Recent Labs    06/18/21 0927 06/19/21 0235 06/20/21 0159  NA 135 139 135  K 3.8 4.1 3.2*  CL 103 111 106  CO2 21* 22 24  GLUCOSE 102* 104* 115*  BUN 15 10 <5*  CREATININE 1.13 1.16 1.01  CALCIUM 9.0 7.7* 8.0*   No results for input(s): LABPT, INR in the last 72 hours. No results for input(s): LABURIN in the last 72 hours. Results for orders placed or performed during the hospital encounter of 06/18/21  Urine Culture     Status: Abnormal   Collection Time: 06/18/21 12:18 PM   Specimen: Urine, Clean Catch  Result Value Ref Range Status   Specimen  Description URINE, CLEAN CATCH  Final   Special Requests   Final    NONE Performed at [facility], [address], [place], [place] [address]    Culture >=100,000 COLONIES/mL ENTEROBACTER AEROGENES (A)  Final   Report Status 06/20/2021 FINAL  Final   Organism ID, Bacteria ENTEROBACTER AEROGENES (A)  Final      Susceptibility   Enterobacter aerogenes - MIC*    CEFAZOLIN >=64 RESISTANT Resistant     CEFEPIME <=0.12 SENSITIVE Sensitive     CEFTRIAXONE <=0.25 SENSITIVE Sensitive     CIPROFLOXACIN <=0.25 SENSITIVE Sensitive     GENTAMICIN <=1 SENSITIVE Sensitive     IMIPENEM 1 SENSITIVE Sensitive     NITROFURANTOIN 64 INTERMEDIATE Intermediate     TRIMETH/SULFA <=20 SENSITIVE Sensitive     PIP/TAZO <=4 SENSITIVE Sensitive     * >=100,000 COLONIES/mL ENTEROBACTER AEROGENES  Resp Panel by RT-PCR (Flu A&B, Covid) Urine, Clean Catch     Status: None   Collection Time: 06/18/21  1:05 PM   Specimen: Urine, Clean Catch; Nasopharyngeal(NP)  swabs in vial transport medium  Result Value Ref Range Status   SARS Coronavirus 2 by RT PCR NEGATIVE NEGATIVE Final    Comment: (NOTE) SARS-CoV-2 target nucleic acids are NOT DETECTED.  The SARS-CoV-2 RNA is generally detectable in upper respiratory specimens during the acute phase of infection. The lowest concentration of SARS-CoV-2 viral copies this assay can detect is 138 copies/mL. A negative result does not preclude SARS-Cov-2 infection and should not be used as the sole basis for treatment or other patient management decisions. A negative result may occur with  improper specimen collection/handling, submission of specimen other than nasopharyngeal swab, presence of viral mutation(s) within the areas targeted by this assay, and inadequate number of viral copies(<138 copies/mL). A negative result must be combined with clinical observations, patient history, and epidemiological information. The expected result is Negative.  Fact Sheet for Patients:  BloggerCourse.com  Fact Sheet for Healthcare Providers:  SeriousBroker.it  This test is no t yet approved or cleared by the [place] FDA and  has been authorized for detection and/or diagnosis of SARS-CoV-2 by FDA under an Emergency Use Authorization (EUA). This EUA will remain  in effect (meaning this test can be used) for the duration of the COVID-19 declaration under Section 564(b)(1) of the Act, 21 U.S.C.section 360bbb-3(b)(1), unless the authorization is terminated  or revoked sooner.       Influenza A by PCR NEGATIVE NEGATIVE Final   Influenza B by PCR NEGATIVE NEGATIVE Final    Comment: (NOTE) The Xpert Xpress SARS-CoV-2/FLU/RSV plus assay is intended as an aid in the diagnosis of influenza from Nasopharyngeal swab specimens and should not be used as a sole basis for treatment. Nasal washings and aspirates are unacceptable for Xpert Xpress  SARS-CoV-2/FLU/RSV testing.  Fact Sheet for Patients: BloggerCourse.com  Fact Sheet for Healthcare Providers: SeriousBroker.it  This test is not yet approved or cleared by the [place] FDA and has been authorized for detection and/or diagnosis of SARS-CoV-2 by FDA under an Emergency Use Authorization (EUA). This EUA will remain in effect (meaning this test can be used) for the duration of the COVID-19 declaration under Section 564(b)(1) of the Act, 21 U.S.C. section 360bbb-3(b)(1), unless the authorization is terminated or revoked.  Performed at [facility], [address] [place], [place] [address]   Culture, blood (routine x 2)     Status: None (Preliminary result)   Collection Time: 06/18/21  8:52 PM   Specimen: BLOOD  Result Value Ref Range Status   Specimen Description BLOOD SITE NOT SPECIFIED  Final   Special Requests   Final    BOTTLES DRAWN AEROBIC ONLY Blood Culture results may not be optimal due to an inadequate volume of blood received in culture bottles   Culture   Final    NO GROWTH 2 DAYS Performed at [facility], [address], [place], [place] [address]    Report Status PENDING  Incomplete  Culture, blood (routine x 2)     Status: None (Preliminary result)   Collection Time: 06/18/21  8:52 PM   Specimen: BLOOD  Result Value Ref Range Status   Specimen Description BLOOD SITE NOT SPECIFIED  Final   Special Requests   Final    BOTTLES DRAWN AEROBIC ONLY Blood Culture results may not be optimal due to an inadequate volume of blood received in culture bottles   Culture   Final    NO GROWTH 2 DAYS Performed at [facility], [address], [place], [place] [address]    Report Status PENDING  Incomplete    Studies/Results: DG C-Arm 1-60 Min  Result Date: 06/18/2021 CLINICAL DATA:  Surgery. Cystoscopy with retrograde pyelogram, ureteroscopy, and stent placement. EXAM: DG C-ARM 1-60  MIN COMPARISON:  CT 06/18/2021 FINDINGS: Intraoperative fluoroscopy is utilized for surgical control purposes during cystoscopic procedure. Fluoroscopy time is recorded at 21.1 seconds. Dose 3.55 mGy. Two spot fluoroscopic images are obtained. Images obtained demonstrate retrograde injection of contrast material into the right ureter. There is a filling defect at the ureteropelvic junction with proximal hydronephrosis in the right kidney. The second image demonstrates placement of a right ureteral stent with partial drainage of the contrast material. IMPRESSION: Intraoperative fluoroscopy utilized for surgical control purposes during right ureteral stent placement. Electronically Signed   By: Burman Nieves M.D.   On: 06/18/2021 19:11   CT Renal Stone Study  Result Date: 06/18/2021 CLINICAL DATA:  Flank pain, kidney stone suspected EXAM: CT ABDOMEN AND PELVIS WITHOUT CONTRAST TECHNIQUE: Multidetector CT imaging of the abdomen and pelvis was performed following the standard protocol without IV contrast. COMPARISON:  None. FINDINGS: Lower chest: No acute abnormality. Hepatobiliary: No focal liver abnormality is seen. No gallstones, gallbladder wall thickening, or biliary dilatation. Pancreas: Unremarkable. Spleen: Unremarkable. Adrenals/Urinary Tract: Adrenals are unremarkable. There is right hydronephrosis. Proximal right hydroureter. There is abrupt transition to decompressed ureter without apparent stone. Left kidney is unremarkable. Partially distended bladder is unremarkable. Stomach/Bowel: Stomach is within normal limits. Bowel is normal in caliber. Normal appendix. Vascular/Lymphatic: No significant vascular abnormality on this noncontrast study. No enlarged lymph nodes. Reproductive: Unremarkable. Other: No free fluid.  Abdominal wall is unremarkable. 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 No acute osseous abnormality. IMPRESSION: Right hydronephrosis and  proximal right hydroureter with abrupt transition to a decompressed ureter. No apparent stone. Recommend contrast enhanced urogram with delay to better evaluate the ureter. Electronically Signed   By: Guadlupe Spanish M.D.   On: 06/18/2021 12:54    Assessment: Procedure(s): CYSTOSCOPY WITH RETROGRADE PYELOGRAM, URETEROSCOPY AND STENT PLACEMENT, 2 Days Post-Op  doing well.  Plan: -continue oral antibiotics for 10-14 days based on sensitivities -pt will need outpatient urology follow up to address stent and schedule diagnostic ureteorscopy ([facility] [phone])   Kasandra Knudsen, MD Urology 06/20/2021, 9:51 AM

## 2021-06-23 LAB — CULTURE, BLOOD (ROUTINE X 2)
Culture: NO GROWTH
Culture: NO GROWTH

## 2021-06-25 NOTE — Anesthesia Postprocedure Evaluation (Signed)
Anesthesia Post Note  Patient: Jordan Patrick  Procedure(s) Performed: CYSTOSCOPY WITH RETROGRADE PYELOGRAM, URETEROSCOPY AND STENT PLACEMENT (Right)     Patient location during evaluation: Other Anesthesia Type: General Level of consciousness: awake and alert Pain management: pain level controlled Vital Signs Assessment: post-procedure vital signs reviewed and stable Respiratory status: spontaneous breathing, nonlabored ventilation and respiratory function stable Cardiovascular status: blood pressure returned to baseline and stable Postop Assessment: no apparent nausea or vomiting Anesthetic complications: no   No notable events documented.  Last Vitals:  Vitals:   06/20/21 0326 06/20/21 1158  BP: 136/78 104/70  Pulse: 100 75  Resp: 20   Temp: 37.2 C 36.8 C  SpO2: 100% 97%    Last Pain:  Vitals:   06/20/21 1158  TempSrc: Oral  PainSc:                  Maelle Sheaffer,W. EDMOND

## 2021-06-29 ENCOUNTER — Other Ambulatory Visit: Payer: Self-pay | Admitting: Urology

## 2021-07-02 ENCOUNTER — Encounter (HOSPITAL_BASED_OUTPATIENT_CLINIC_OR_DEPARTMENT_OTHER): Payer: Self-pay | Admitting: Urology

## 2021-07-02 ENCOUNTER — Other Ambulatory Visit: Payer: Self-pay

## 2021-07-02 NOTE — Progress Notes (Signed)
Spoke w/ via phone for pre-op interview---pt with spanish interpreter Jordan Patrick interpreters number [phone] Lab needs dos----  none             Lab results------cbc and bmp 06-20-2021 epic COVID test -----patient states asymptomatic no test needed Arrive at -------815 am 07-10-2021 NPO after MN NO Solid Food.  Water  from MN until---715 am then npo Med rec completed Medications to take morning of surgery -----tamsulosin Diabetic medication -----n/a Patient instructed to bring photo id and insurance card day of surgery Patient aware to have Driver (ride ) / caregiver    for 24 hours after surgery  driver : sister Jordan Patrick cell [phone] will drop pt off, girlfriend Jordan Patrick will stay  Patient Special Instructions -----none Pre-Op special Istructions -----none Patient verbalized understanding of instructions that were given at this phone interview. Patient denies shortness of breath, chest pain, fever, cough at this phone interview.   Spanish interpreter requested for [employer] on chart

## 2021-07-10 ENCOUNTER — Ambulatory Visit (HOSPITAL_BASED_OUTPATIENT_CLINIC_OR_DEPARTMENT_OTHER): Payer: Self-pay | Admitting: Certified Registered"

## 2021-07-10 ENCOUNTER — Encounter (HOSPITAL_BASED_OUTPATIENT_CLINIC_OR_DEPARTMENT_OTHER)
Admission: RE | Disposition: A | Discharge status: Home / Self Care | Payer: Self-pay | Source: Home / Self Care | Attending: Urology

## 2021-07-10 ENCOUNTER — Encounter (HOSPITAL_BASED_OUTPATIENT_CLINIC_OR_DEPARTMENT_OTHER): Payer: Self-pay | Admitting: Urology

## 2021-07-10 ENCOUNTER — Other Ambulatory Visit: Payer: Self-pay | Admitting: Urology

## 2021-07-10 ENCOUNTER — Ambulatory Visit (HOSPITAL_BASED_OUTPATIENT_CLINIC_OR_DEPARTMENT_OTHER)
Admission: RE | Admit: 2021-07-10 | Discharge: 2021-07-10 | Disposition: A | Discharge status: Home / Self Care | Payer: Self-pay | Attending: Urology | Admitting: Urology

## 2021-07-10 DIAGNOSIS — N1330 Unspecified hydronephrosis: Secondary | ICD-10-CM

## 2021-07-10 DIAGNOSIS — Z87442 Personal history of urinary calculi: Secondary | ICD-10-CM | POA: Insufficient documentation

## 2021-07-10 HISTORY — DX: Unspecified hydronephrosis: N13.30

## 2021-07-10 HISTORY — DX: Painful micturition, unspecified: R30.9

## 2021-07-10 HISTORY — PX: CYSTOSCOPY WITH RETROGRADE PYELOGRAM, URETEROSCOPY AND STENT PLACEMENT: SHX5789

## 2021-07-10 SURGERY — CYSTOURETEROSCOPY, WITH RETROGRADE PYELOGRAM AND STENT INSERTION
Anesthesia: General | Site: Renal | Laterality: Right

## 2021-07-10 MED ORDER — PHENYLEPHRINE 40 MCG/ML (10ML) SYRINGE FOR IV PUSH (FOR BLOOD PRESSURE SUPPORT)
PREFILLED_SYRINGE | INTRAVENOUS | Status: AC
  Filled 2021-07-10: qty 10

## 2021-07-10 MED ORDER — FENTANYL CITRATE (PF) 100 MCG/2ML IJ SOLN
INTRAMUSCULAR | Status: AC
  Filled 2021-07-10: qty 2

## 2021-07-10 MED ORDER — DEXAMETHASONE SODIUM PHOSPHATE 10 MG/ML IJ SOLN
INTRAMUSCULAR | Status: DC | PRN
  Administered 2021-07-10: 10 mg via INTRAVENOUS

## 2021-07-10 MED ORDER — ACETAMINOPHEN 500 MG PO TABS
ORAL_TABLET | ORAL | Status: AC
  Filled 2021-07-10: qty 2

## 2021-07-10 MED ORDER — LIDOCAINE 2% (20 MG/ML) 5 ML SYRINGE
INTRAMUSCULAR | Status: DC | PRN
  Administered 2021-07-10: 60 mg via INTRAVENOUS

## 2021-07-10 MED ORDER — CEFAZOLIN SODIUM-DEXTROSE 2-4 GM/100ML-% IV SOLN
INTRAVENOUS | Status: AC
  Filled 2021-07-10: qty 100

## 2021-07-10 MED ORDER — LIDOCAINE 2% (20 MG/ML) 5 ML SYRINGE
INTRAMUSCULAR | Status: AC
  Filled 2021-07-10: qty 5

## 2021-07-10 MED ORDER — SODIUM CHLORIDE 0.9 % IR SOLN
Status: DC | PRN
  Administered 2021-07-10 (×2): 3000 mL

## 2021-07-10 MED ORDER — PROPOFOL 10 MG/ML IV BOLUS
INTRAVENOUS | Status: DC | PRN
  Administered 2021-07-10: 200 mg via INTRAVENOUS

## 2021-07-10 MED ORDER — FENTANYL CITRATE (PF) 100 MCG/2ML IJ SOLN
INTRAMUSCULAR | Status: DC | PRN
  Administered 2021-07-10 (×2): 50 ug via INTRAVENOUS
  Administered 2021-07-10: 25 ug via INTRAVENOUS

## 2021-07-10 MED ORDER — PROPOFOL 10 MG/ML IV BOLUS
INTRAVENOUS | Status: AC
  Filled 2021-07-10: qty 20

## 2021-07-10 MED ORDER — KETOROLAC TROMETHAMINE 30 MG/ML IJ SOLN
INTRAMUSCULAR | Status: AC
  Filled 2021-07-10: qty 1

## 2021-07-10 MED ORDER — OXYCODONE HCL 5 MG PO TABS
5.0000 mg | ORAL_TABLET | Freq: Once | ORAL | Status: AC
  Administered 2021-07-10: 5 mg via ORAL

## 2021-07-10 MED ORDER — KETOROLAC TROMETHAMINE 30 MG/ML IJ SOLN
15.0000 mg | Freq: Once | INTRAMUSCULAR | Status: AC
  Administered 2021-07-10: 15 mg via INTRAVENOUS

## 2021-07-10 MED ORDER — FENTANYL CITRATE (PF) 100 MCG/2ML IJ SOLN
25.0000 ug | INTRAMUSCULAR | Status: DC | PRN
  Administered 2021-07-10: 50 ug via INTRAVENOUS
  Administered 2021-07-10 (×2): 25 ug via INTRAVENOUS

## 2021-07-10 MED ORDER — ACETAMINOPHEN 500 MG PO TABS
1000.0000 mg | ORAL_TABLET | Freq: Once | ORAL | Status: AC
  Administered 2021-07-10: 1000 mg via ORAL

## 2021-07-10 MED ORDER — MIDAZOLAM HCL 5 MG/5ML IJ SOLN
INTRAMUSCULAR | Status: DC | PRN
  Administered 2021-07-10: 2 mg via INTRAVENOUS

## 2021-07-10 MED ORDER — ONDANSETRON HCL 4 MG/2ML IJ SOLN
INTRAMUSCULAR | Status: DC | PRN
  Administered 2021-07-10: 4 mg via INTRAVENOUS

## 2021-07-10 MED ORDER — PHENYLEPHRINE 40 MCG/ML (10ML) SYRINGE FOR IV PUSH (FOR BLOOD PRESSURE SUPPORT)
PREFILLED_SYRINGE | INTRAVENOUS | Status: DC | PRN
  Administered 2021-07-10: 80 ug via INTRAVENOUS
  Administered 2021-07-10: 120 ug via INTRAVENOUS

## 2021-07-10 MED ORDER — EPHEDRINE 5 MG/ML INJ
INTRAVENOUS | Status: AC
  Filled 2021-07-10: qty 5

## 2021-07-10 MED ORDER — GLYCOPYRROLATE PF 0.2 MG/ML IJ SOSY
PREFILLED_SYRINGE | INTRAMUSCULAR | Status: AC
  Filled 2021-07-10: qty 1

## 2021-07-10 MED ORDER — CEPHALEXIN 250 MG PO CAPS: 250.0000 mg | ORAL_CAPSULE | Freq: Every day | ORAL | 0 refills | Status: AC

## 2021-07-10 MED ORDER — TRAMADOL HCL 50 MG PO TABS: 50.0000 mg | ORAL_TABLET | Freq: Four times a day (QID) | ORAL | 0 refills | Status: DC | PRN

## 2021-07-10 MED ORDER — CEFAZOLIN SODIUM-DEXTROSE 2-4 GM/100ML-% IV SOLN
2.0000 g | INTRAVENOUS | Status: AC
  Administered 2021-07-10: 2 g via INTRAVENOUS

## 2021-07-10 MED ORDER — LACTATED RINGERS IV SOLN: INTRAVENOUS | Status: DC

## 2021-07-10 MED ORDER — GLYCOPYRROLATE PF 0.2 MG/ML IJ SOSY
PREFILLED_SYRINGE | INTRAMUSCULAR | Status: DC | PRN
  Administered 2021-07-10: .2 mg via INTRAVENOUS

## 2021-07-10 MED ORDER — TAMSULOSIN HCL 0.4 MG PO CAPS: 0.4000 mg | ORAL_CAPSULE | Freq: Every day | ORAL | 0 refills | Status: DC

## 2021-07-10 MED ORDER — 0.9 % SODIUM CHLORIDE (POUR BTL) OPTIME
TOPICAL | Status: DC | PRN
  Administered 2021-07-10: 500 mL

## 2021-07-10 MED ORDER — OXYCODONE HCL 5 MG PO TABS
ORAL_TABLET | ORAL | Status: AC
  Filled 2021-07-10: qty 1

## 2021-07-10 MED ORDER — MIDAZOLAM HCL 2 MG/2ML IJ SOLN
INTRAMUSCULAR | Status: AC
  Filled 2021-07-10: qty 2

## 2021-07-10 MED ORDER — AMISULPRIDE (ANTIEMETIC) 5 MG/2ML IV SOLN: 10.0000 mg | Freq: Once | INTRAVENOUS | Status: DC | PRN

## 2021-07-10 MED ORDER — IOHEXOL 300 MG/ML  SOLN
INTRAMUSCULAR | Status: DC | PRN
  Administered 2021-07-10: 20 mL via URETHRAL

## 2021-07-10 SURGICAL SUPPLY — 22 items
BAG DRAIN URO-CYSTO SKYTR STRL (DRAIN) ×3 IMPLANT
BAG DRN UROCATH (DRAIN) ×2
BALLN NEPHROSTOMY (BALLOONS)
BALLOON NEPHROSTOMY (BALLOONS) IMPLANT
CATH URET 5FR 28IN OPEN ENDED (CATHETERS) ×3 IMPLANT
CLOTH BEACON ORANGE TIMEOUT ST (SAFETY) ×3 IMPLANT
GLOVE SURG ENC MOIS LTX SZ6.5 (GLOVE) ×3 IMPLANT
GLOVE SURG NEOP MICRO LF SZ6.5 (GLOVE) ×6 IMPLANT
GOWN STRL REUS W/ TWL LRG LVL3 (GOWN DISPOSABLE) ×2 IMPLANT
GOWN STRL REUS W/TWL LRG LVL3 (GOWN DISPOSABLE) ×6 IMPLANT
GUIDEWIRE STR DUAL SENSOR (WIRE) ×6 IMPLANT
IV NS IRRIG 3000ML ARTHROMATIC (IV SOLUTION) ×6 IMPLANT
KIT BALLN UROMAX 15FX4 (MISCELLANEOUS) ×2 IMPLANT
KIT BALLN UROMAX 26 75X4 (MISCELLANEOUS) ×3
KIT TURNOVER CYSTO (KITS) ×3 IMPLANT
MANIFOLD NEPTUNE II (INSTRUMENTS) ×3 IMPLANT
NS IRRIG 500ML POUR BTL (IV SOLUTION) ×3 IMPLANT
PACK CYSTO (CUSTOM PROCEDURE TRAY) ×3 IMPLANT
SHEATH URETERAL 12FRX35CM (MISCELLANEOUS) ×3 IMPLANT
STENT URET 6FRX26 CONTOUR (STENTS) ×3 IMPLANT
TUBE CONNECTING 12X1/4 (SUCTIONS) ×3 IMPLANT
TUBING UROLOGY SET (TUBING) ×3 IMPLANT

## 2021-07-10 NOTE — Anesthesia Preprocedure Evaluation (Signed)
Anesthesia Evaluation  Patient identified by MRN, date of birth, ID band Patient awake    Reviewed: Allergy & Precautions, NPO status , Patient's Chart, lab work & pertinent test results  Airway Mallampati: II  TM Distance: >3 FB Neck ROM: Full    Dental   Pulmonary neg pulmonary ROS,    breath sounds clear to auscultation       Cardiovascular negative cardio ROS   Rhythm:Regular Rate:Normal     Neuro/Psych negative neurological ROS     GI/Hepatic negative GI ROS, Neg liver ROS,   Endo/Other  negative endocrine ROS  Renal/GU Renal disease     Musculoskeletal   Abdominal   Peds  Hematology negative hematology ROS (+)   Anesthesia Other Findings   Reproductive/Obstetrics                             Anesthesia Physical Anesthesia Plan  ASA: 2  Anesthesia Plan: General   Post-op Pain Management:    Induction: Intravenous  PONV Risk Score and Plan: 2 and Dexamethasone, Ondansetron and Treatment may vary due to age or medical condition  Airway Management Planned: LMA  Additional Equipment:   Intra-op Plan:   Post-operative Plan: Extubation in OR  Informed Consent: I have reviewed the patients History and Physical, chart, labs and discussed the procedure including the risks, benefits and alternatives for the proposed anesthesia with the patient or authorized representative who has indicated his/her understanding and acceptance.     Dental advisory given  Plan Discussed with: CRNA  Anesthesia Plan Comments:         Anesthesia Quick Evaluation

## 2021-07-10 NOTE — OR Nursing (Signed)
Right ureteral stent removed by Dr. Arita Miss in [facility] OR #2.

## 2021-07-10 NOTE — Progress Notes (Signed)
Spoke with Dr Arita Miss regarding continued complaints of pain in abdomen and back order for additional pain medication received

## 2021-07-10 NOTE — Op Note (Signed)
Preoperative diagnosis: right hydronephrosis  Postoperative diagnosis: right hydronephrosis  Procedure:  Cystoscopy Right diagnostic ureteroscopy Right ureteral balloon dilation Right 73F x 26 ureteral exchange Right retrograde pyelography with interpretation  Surgeon: Kasandra Knudsen, MD  Anesthesia: General  Complications: None  Intraoperative findings:  Normal urethra Bilateral lobe hypertrophy prostatic urethra Bilateral orthotropic ureteral orifices Right retrograde pyelography demonstrated a narrowing in proximal ureter Proximal ureteral narrowing that would allow flexible ureterscope to pass with gentle pressure  Bladder mucosa normal without masses   EBL: Minimal  Specimens: none  Disposition of specimens: [facility] for stone analysis  Indication: Jordan Patrick is a 50 y.o.   patient who initially presented to the ED with right flank pain and urine consistent with infection.  Imaging revealed right hydronephrosis to a sharp transition point in the proximal ureter without obstructing stone or mass.  He underwent urgent ureteral stenting secondary to infection for urosepsis.  He now returns for diagnostic ureteroscopy.  After reviewing the management options for treatment, the patient elected to proceed with the above surgical procedure(s). We have discussed the potential benefits and risks of the procedure, side effects of the proposed treatment, the likelihood of the patient achieving the goals of the procedure, and any potential problems that might occur during the procedure or recuperation. Informed consent has been obtained.   Description of procedure:  The patient was taken to the operating room and general anesthesia was induced.  The patient was placed in the dorsal lithotomy position, prepped and draped in the usual sterile fashion, and preoperative antibiotics were administered. A preoperative time-out was performed.   Cystourethroscopy  was performed.  The patient's urethra was examined and demonstrated bilobar prostatic hypertrophy. The bladder was then systematically examined in its entirety. There was no evidence for any bladder tumors, stones, or other mucosal pathology.    Attention then turned to the right ureteral orifice and graspers were used to bring the existing stent to the urethral meatus.  A 0.38 sensor wire was then advanced through the ureteral stent to the renal pelvis.  The stent was removed.  An open-ended ureteral catheter was then placed over the wire and advanced to the mid ureter.  The wire was removed.  A retrograde pyelogram was then obtained which showed mild dilation of the right kidney to an abrupt narrowing in the proximal ureter.    A 0.38 sensor wire was then placed back through the ureteral catheter and the catheter was removed.  Semirigid ureteroscopy then took place alongside the wire.  The UPJ was examined and noted to be narrow.  No stone was seen distally.  A second wire was placed through the ureteroscope.  The ureteroscope was removed.  A ureteral access sheath was placed over the second wire advanced to the proximal ureter with fluoroscopic guidance.  The inner sheath and wire removed.  Flexible ureteroscopy then took place.  The proximal ureter was noted to be narrow and tortuous.  It took several attempts to traverse this narrowing to get to the renal pelvis.  Once in the renal pelvis pyeloscopy took place and no abnormality or stone was noted.  The decision was made to attempt balloon dilation of the proximal ureteral narrowing.  The ureteral access sheath was removed.  This was then done with the UroMax balloon dilator.  After dilation was complete the balloon dilator was removed.  Care was taken to ensure the safety wire remained in place.  A 6 French by 26 cm ureteral stent  was then placed over the safety wire with fluoroscopic and cystoscopic guidance.  A proximal curl was seen in proper  position.  Distal curl seen under direct visualization. The patient's bladder was decompressed and the cystoscope was removed.  The patient was awakened from anesthesia and transferred the PACU in stable condition.  Disposition: No tether was left on the stent. He will be scheduled for contrasted scan to look for crossing vessel.  He will remain on low-dose antibiotics in the interim.

## 2021-07-10 NOTE — Transfer of Care (Signed)
Immediate Anesthesia Transfer of Care Note  Patient: Jordan Patrick  Procedure(s) Performed: CYSTOSCOPY WITH RETROGRADE PYELOGRAM, DIAGNOSTIC URETEROSCOPY, BALLOON DILATION AND STENT EXCHANGE (Right: Renal)  Patient Location: PACU  Anesthesia Type:General  Level of Consciousness: sedated and patient cooperative  Airway & Oxygen Therapy: Patient Spontanous Breathing and Patient connected to face mask oxygen  Post-op Assessment: Report given to RN, Post -op Vital signs reviewed and stable and Patient moving all extremities  Post vital signs: Reviewed and stable  Last Vitals:  Vitals Value Taken Time  BP    Temp    Pulse 72 07/10/21 1136  Resp 14 07/10/21 1136  SpO2 99 % 07/10/21 1136  Vitals shown include unvalidated device data.  Last Pain:  Vitals:   07/10/21 0835  TempSrc: Oral  PainSc: 0-No pain      Patients Stated Pain Goal: 5 (07/10/21 0835)  Complications: No notable events documented.

## 2021-07-10 NOTE — Discharge Instructions (Addendum)
DISCHARGE INSTRUCTIONS FOR URETEROSCOPY for HYDRONEPHROSIS  MEDICATIONS:  1. Resume all your other meds from home  2. AZO over the counter can help with the burning/stinging when you urinate. 3. Tramadol is for moderate/severe pain, otherwise taking up to 1000 mg every 6 hours of plainTylenol will help treat your pain.   4. Take cephalexin daily for UTI prevention   ACTIVITY:  1. No strenuous activity x 1week  2. No driving while on narcotic pain medications  3. Drink plenty of water  4. Continue to walk at home - you can still get blood clots when you are at home, so keep active, but don't over do it.  5. May return to work/school tomorrow or when you feel ready   BATHING:  1. You can shower and we recommend daily showers    SIGNS/SYMPTOMS TO CALL:  Please call us if you have a fever greater than 101.5, uncontrolled nausea/vomiting, uncontrolled pain, dizziness, unable to urinate, bloody urine, chest pain, shortness of breath, leg swelling, leg pain, redness around wound, drainage from wound, or any other concerns or questions.   You can reach [place] at [phone].   FOLLOW-UP:  1. You will be scheduled for CT scan in the office to evaluate for UPJ obstruction.  You will be contacted for this appointment.    NO TYLENOL CONTAINING PRODUCTS UNTIL AFTER 3:00 PM TODAY.    Post Anesthesia Home Care Instructions  Activity: Get plenty of rest for the remainder of the day. A responsible individual must stay with you for 24 hours following the procedure.  For the next 24 hours, DO NOT: -Drive a car -Advertising copywriter -Drink alcoholic beverages -Take any medication unless instructed by your physician -Make any legal decisions or sign important papers.  Meals: Start with liquid foods such as gelatin or soup. Progress to regular foods as tolerated. Avoid greasy, spicy, heavy foods. If nausea and/or vomiting occur, drink only clear liquids until the nausea and/or vomiting subsides.  Call your physician if vomiting continues.  Special Instructions/Symptoms: Your throat may feel dry or sore from the anesthesia or the breathing tube placed in your throat during surgery. If this causes discomfort, gargle with warm salt water. The discomfort should disappear within 24 hours.      No advil/ibuprofen until after 7pm tonight   Oxycodone 5mg  given today at 1:07pm  Call Doctor office for follow up appointment for Thursday if problems with pain continues otherwise wait for CT scan appointment

## 2021-07-10 NOTE — Anesthesia Postprocedure Evaluation (Signed)
Anesthesia Post Note  Patient: Jordan Patrick  Procedure(s) Performed: CYSTOSCOPY WITH RETROGRADE PYELOGRAM, DIAGNOSTIC URETEROSCOPY, BALLOON DILATION AND STENT EXCHANGE (Right: Renal)     Patient location during evaluation: PACU Anesthesia Type: General Level of consciousness: awake and alert Pain management: pain level controlled Vital Signs Assessment: post-procedure vital signs reviewed and stable Respiratory status: spontaneous breathing, nonlabored ventilation, respiratory function stable and patient connected to nasal cannula oxygen Cardiovascular status: blood pressure returned to baseline and stable Postop Assessment: no apparent nausea or vomiting Anesthetic complications: no   No notable events documented.  Last Vitals:  Vitals:   07/10/21 1145 07/10/21 1200  BP: 106/77 107/90  Pulse: 78 84  Resp: 16 13  Temp: 36.6 C   SpO2: 95% 97%    Last Pain:  Vitals:   07/10/21 1145  TempSrc:   PainSc: 0-No pain                 Kennieth Rad

## 2021-07-10 NOTE — Interval H&P Note (Signed)
History and Physical Interval Note: Patient previously underwent right ureteral stenting for infection associated with right hydronephrosis. Plan for diagnostic ureteroscopy today.  He has been treated appropriately with antibiotics.   07/10/2021 8:16 AM  Wynelle Bourgeois  has presented today for surgery, with the diagnosis of RIGHT HYDRONEPHROSIS.  The various methods of treatment have been discussed with the patient and family. After consideration of risks, benefits and other options for treatment, the patient has consented to  Procedure(s): CYSTOSCOPY WITH RETROGRADE PYELOGRAM, DIAGNOSTIC URETEROSCOPY, POSSIBLE BIOPSY AND STENT EXCHANGE (Right) HOLMIUM LASER APPLICATION (Right) as a surgical intervention.  The patient's history has been reviewed, patient examined, no change in status, stable for surgery.  I have reviewed the patient's chart and labs.  Questions were answered to the patient's satisfaction.     Marjani Kobel D August Longest

## 2021-07-10 NOTE — Anesthesia Procedure Notes (Signed)
Procedure Name: LMA Insertion Date/Time: 07/10/2021 10:46 AM Performed by: Bishop Limbo, CRNA Pre-anesthesia Checklist: Patient identified, Emergency Drugs available, Suction available and Patient being monitored Patient Re-evaluated:Patient Re-evaluated prior to induction Oxygen Delivery Method: Circle System Utilized Preoxygenation: Pre-oxygenation with 100% oxygen Induction Type: IV induction Ventilation: Mask ventilation without difficulty LMA: LMA inserted LMA Size: 5.0 Number of attempts: 1 Placement Confirmation: positive ETCO2 Tube secured with: Tape Dental Injury: Teeth and Oropharynx as per pre-operative assessment

## 2021-07-11 ENCOUNTER — Encounter (HOSPITAL_BASED_OUTPATIENT_CLINIC_OR_DEPARTMENT_OTHER): Payer: Self-pay | Admitting: Urology

## 2021-08-03 ENCOUNTER — Encounter (HOSPITAL_COMMUNITY): Payer: Self-pay | Admitting: *Deleted

## 2021-08-03 ENCOUNTER — Emergency Department (HOSPITAL_COMMUNITY): Payer: Self-pay

## 2021-08-03 ENCOUNTER — Observation Stay (HOSPITAL_COMMUNITY): Payer: Self-pay | Admitting: Certified Registered Nurse Anesthetist

## 2021-08-03 ENCOUNTER — Inpatient Hospital Stay (HOSPITAL_COMMUNITY)
Admission: EM | Admit: 2021-08-03 | Discharge: 2021-08-07 | DRG: 659 | Disposition: A | Discharge status: Home / Self Care | Payer: Self-pay | Attending: Family Medicine | Admitting: Family Medicine

## 2021-08-03 ENCOUNTER — Encounter (HOSPITAL_COMMUNITY)
Admission: EM | Disposition: A | Discharge status: Home / Self Care | Payer: Self-pay | Source: Home / Self Care | Attending: Family Medicine

## 2021-08-03 ENCOUNTER — Other Ambulatory Visit: Payer: Self-pay

## 2021-08-03 DIAGNOSIS — T7840XA Allergy, unspecified, initial encounter: Secondary | ICD-10-CM

## 2021-08-03 DIAGNOSIS — T83122A Displacement of urinary stent, initial encounter: Secondary | ICD-10-CM

## 2021-08-03 DIAGNOSIS — N400 Enlarged prostate without lower urinary tract symptoms: Secondary | ICD-10-CM | POA: Diagnosis present

## 2021-08-03 DIAGNOSIS — L299 Pruritus, unspecified: Secondary | ICD-10-CM | POA: Diagnosis not present

## 2021-08-03 DIAGNOSIS — N1330 Unspecified hydronephrosis: Secondary | ICD-10-CM | POA: Diagnosis present

## 2021-08-03 DIAGNOSIS — K5900 Constipation, unspecified: Secondary | ICD-10-CM

## 2021-08-03 DIAGNOSIS — N136 Pyonephrosis: Secondary | ICD-10-CM | POA: Diagnosis present

## 2021-08-03 DIAGNOSIS — B9689 Other specified bacterial agents as the cause of diseases classified elsewhere: Secondary | ICD-10-CM | POA: Diagnosis present

## 2021-08-03 DIAGNOSIS — Z20822 Contact with and (suspected) exposure to covid-19: Secondary | ICD-10-CM | POA: Diagnosis present

## 2021-08-03 DIAGNOSIS — T368X5A Adverse effect of other systemic antibiotics, initial encounter: Secondary | ICD-10-CM | POA: Diagnosis not present

## 2021-08-03 DIAGNOSIS — N390 Urinary tract infection, site not specified: Secondary | ICD-10-CM

## 2021-08-03 DIAGNOSIS — Y92230 Patient room in hospital as the place of occurrence of the external cause: Secondary | ICD-10-CM | POA: Diagnosis not present

## 2021-08-03 DIAGNOSIS — A419 Sepsis, unspecified organism: Secondary | ICD-10-CM | POA: Diagnosis present

## 2021-08-03 DIAGNOSIS — Y732 Prosthetic and other implants, materials and accessory gastroenterology and urology devices associated with adverse incidents: Secondary | ICD-10-CM | POA: Diagnosis present

## 2021-08-03 DIAGNOSIS — A4159 Other Gram-negative sepsis: Secondary | ICD-10-CM | POA: Diagnosis present

## 2021-08-03 DIAGNOSIS — T83592A Infection and inflammatory reaction due to indwelling ureteral stent, initial encounter: Principal | ICD-10-CM | POA: Diagnosis present

## 2021-08-03 DIAGNOSIS — Y831 Surgical operation with implant of artificial internal device as the cause of abnormal reaction of the patient, or of later complication, without mention of misadventure at the time of the procedure: Secondary | ICD-10-CM | POA: Diagnosis present

## 2021-08-03 DIAGNOSIS — R3915 Urgency of urination: Secondary | ICD-10-CM | POA: Diagnosis not present

## 2021-08-03 HISTORY — PX: CYSTOSCOPY WITH RETROGRADE PYELOGRAM, URETEROSCOPY AND STENT PLACEMENT: SHX5789

## 2021-08-03 LAB — URINALYSIS, ROUTINE W REFLEX MICROSCOPIC
Bilirubin Urine: NEGATIVE
Glucose, UA: NEGATIVE mg/dL
Ketones, ur: 80 mg/dL — AB
Nitrite: NEGATIVE
Protein, ur: 100 mg/dL — AB
RBC / HPF: >50 RBC/hpf — ABNORMAL HIGH (ref 0–5)
Specific Gravity, Urine: 1.018 (ref 1.005–1.030)
WBC, UA: >50 WBC/hpf — ABNORMAL HIGH (ref 0–5)
pH: 6 (ref 5.0–8.0)

## 2021-08-03 LAB — COMPREHENSIVE METABOLIC PANEL
ALT: 12 U/L (ref 0–44)
AST: 15 U/L (ref 15–41)
Albumin: 3.3 g/dL — ABNORMAL LOW (ref 3.5–5.0)
Alkaline Phosphatase: 73 U/L (ref 38–126)
Anion gap: 13 (ref 5–15)
BUN: 11 mg/dL (ref 6–20)
CO2: 21 mmol/L — ABNORMAL LOW (ref 22–32)
Calcium: 9 mg/dL (ref 8.9–10.3)
Chloride: 99 mmol/L (ref 98–111)
Creatinine, Ser: 1.18 mg/dL (ref 0.61–1.24)
GFR, Estimated: >60 mL/min (ref >60)
Glucose, Bld: 109 mg/dL — ABNORMAL HIGH (ref 70–99)
Potassium: 3.8 mmol/L (ref 3.5–5.1)
Sodium: 133 mmol/L — ABNORMAL LOW (ref 135–145)
Total Bilirubin: 1 mg/dL (ref 0.3–1.2)
Total Protein: 7.8 g/dL (ref 6.5–8.1)

## 2021-08-03 LAB — RESP PANEL BY RT-PCR (FLU A&B, COVID) ARPGX2
Influenza A by PCR: NEGATIVE
Influenza B by PCR: NEGATIVE
SARS Coronavirus 2 by RT PCR: NEGATIVE

## 2021-08-03 LAB — CBC WITH DIFFERENTIAL/PLATELET
Abs Immature Granulocytes: 0.06 10*3/uL (ref 0.00–0.07)
Basophils Absolute: 0 10*3/uL (ref 0.0–0.1)
Basophils Relative: 0 %
Eosinophils Absolute: 0 10*3/uL (ref 0.0–0.5)
Eosinophils Relative: 0 %
HCT: 39.6 % (ref 39.0–52.0)
Hemoglobin: 13 g/dL (ref 13.0–17.0)
Immature Granulocytes: 0 %
Lymphocytes Relative: 11 %
Lymphs Abs: 1.5 10*3/uL (ref 0.7–4.0)
MCH: 29.7 pg (ref 26.0–34.0)
MCHC: 32.8 g/dL (ref 30.0–36.0)
MCV: 90.4 fL (ref 80.0–100.0)
Monocytes Absolute: 1.3 10*3/uL — ABNORMAL HIGH (ref 0.1–1.0)
Monocytes Relative: 9 %
Neutro Abs: 10.7 10*3/uL — ABNORMAL HIGH (ref 1.7–7.7)
Neutrophils Relative %: 80 %
Platelets: 426 10*3/uL — ABNORMAL HIGH (ref 150–400)
RBC: 4.38 MIL/uL (ref 4.22–5.81)
RDW: 12.5 % (ref 11.5–15.5)
WBC: 13.6 10*3/uL — ABNORMAL HIGH (ref 4.0–10.5)
nRBC: 0 % (ref 0.0–0.2)

## 2021-08-03 LAB — PROTIME-INR
INR: 1.1 (ref 0.8–1.2)
Prothrombin Time: 13.9 seconds (ref 11.4–15.2)

## 2021-08-03 LAB — LACTIC ACID, PLASMA
Lactic Acid, Venous: 1.2 mmol/L (ref 0.5–1.9)
Lactic Acid, Venous: 1.2 mmol/L (ref 0.5–1.9)

## 2021-08-03 LAB — APTT: aPTT: 31 seconds (ref 24–36)

## 2021-08-03 SURGERY — CYSTOURETEROSCOPY, WITH RETROGRADE PYELOGRAM AND STENT INSERTION
Anesthesia: General | Site: Ureter | Laterality: Right

## 2021-08-03 MED ORDER — DOCUSATE SODIUM 100 MG PO CAPS
100.0000 mg | ORAL_CAPSULE | Freq: Two times a day (BID) | ORAL | Status: DC
  Administered 2021-08-04 – 2021-08-07 (×8): 100 mg via ORAL
  Filled 2021-08-03 (×8): qty 1

## 2021-08-03 MED ORDER — ONDANSETRON HCL 4 MG/2ML IJ SOLN
INTRAMUSCULAR | Status: DC | PRN
  Administered 2021-08-03: 4 mg via INTRAVENOUS

## 2021-08-03 MED ORDER — LIDOCAINE HCL (PF) 2 % IJ SOLN
INTRAMUSCULAR | Status: DC | PRN
  Administered 2021-08-03: 40 mg via INTRADERMAL

## 2021-08-03 MED ORDER — LACTATED RINGERS IV SOLN: INTRAVENOUS | Status: DC | PRN

## 2021-08-03 MED ORDER — MORPHINE SULFATE (PF) 4 MG/ML IV SOLN
4.0000 mg | Freq: Once | INTRAVENOUS | Status: AC
  Administered 2021-08-03: 4 mg via INTRAVENOUS
  Filled 2021-08-03: qty 1

## 2021-08-03 MED ORDER — LACTATED RINGERS IV BOLUS
2250.0000 mL | Freq: Once | INTRAVENOUS | Status: AC
  Administered 2021-08-03: 2250 mL via INTRAVENOUS

## 2021-08-03 MED ORDER — DIPHENHYDRAMINE HCL 50 MG/ML IJ SOLN
12.5000 mg | Freq: Four times a day (QID) | INTRAMUSCULAR | Status: DC | PRN
  Filled 2021-08-03 (×2): qty 1

## 2021-08-03 MED ORDER — SUCCINYLCHOLINE CHLORIDE 200 MG/10ML IV SOSY
PREFILLED_SYRINGE | INTRAVENOUS | Status: DC | PRN
  Administered 2021-08-03: 120 mg via INTRAVENOUS

## 2021-08-03 MED ORDER — OXYCODONE HCL 5 MG PO TABS
5.0000 mg | ORAL_TABLET | ORAL | Status: DC | PRN
  Administered 2021-08-05 (×2): 5 mg via ORAL
  Filled 2021-08-03 (×2): qty 1

## 2021-08-03 MED ORDER — FENTANYL CITRATE (PF) 100 MCG/2ML IJ SOLN
INTRAMUSCULAR | Status: AC
  Filled 2021-08-03: qty 2

## 2021-08-03 MED ORDER — LACTATED RINGERS IV BOLUS: 2000.0000 mL | Freq: Once | INTRAVENOUS | Status: DC

## 2021-08-03 MED ORDER — FENTANYL CITRATE (PF) 100 MCG/2ML IJ SOLN
INTRAMUSCULAR | Status: DC | PRN
  Administered 2021-08-03 (×2): 50 ug via INTRAVENOUS

## 2021-08-03 MED ORDER — MIDAZOLAM HCL 2 MG/2ML IJ SOLN
INTRAMUSCULAR | Status: AC
  Filled 2021-08-03: qty 2

## 2021-08-03 MED ORDER — PROPOFOL 10 MG/ML IV BOLUS
INTRAVENOUS | Status: AC
  Filled 2021-08-03: qty 20

## 2021-08-03 MED ORDER — SENNA 8.6 MG PO TABS
1.0000 | ORAL_TABLET | Freq: Two times a day (BID) | ORAL | Status: DC
  Administered 2021-08-04 – 2021-08-07 (×8): 8.6 mg via ORAL
  Filled 2021-08-03 (×8): qty 1

## 2021-08-03 MED ORDER — SODIUM CHLORIDE 0.9 % IV SOLN
2.0000 g | Freq: Once | INTRAVENOUS | Status: AC
  Administered 2021-08-03: 2 g via INTRAVENOUS
  Filled 2021-08-03: qty 20

## 2021-08-03 MED ORDER — PROPOFOL 10 MG/ML IV BOLUS
INTRAVENOUS | Status: DC | PRN
  Administered 2021-08-03: 150 mg via INTRAVENOUS

## 2021-08-03 MED ORDER — ACETAMINOPHEN 325 MG PO TABS
650.0000 mg | ORAL_TABLET | ORAL | Status: DC | PRN
  Administered 2021-08-04: 650 mg via ORAL
  Filled 2021-08-03: qty 2

## 2021-08-03 MED ORDER — LIDOCAINE HCL (PF) 2 % IJ SOLN
INTRAMUSCULAR | Status: AC
  Filled 2021-08-03: qty 5

## 2021-08-03 MED ORDER — DEXAMETHASONE SODIUM PHOSPHATE 10 MG/ML IJ SOLN
INTRAMUSCULAR | Status: DC | PRN
  Administered 2021-08-03: 8 mg via INTRAVENOUS

## 2021-08-03 MED ORDER — MORPHINE SULFATE (PF) 2 MG/ML IV SOLN: 2.0000 mg | INTRAVENOUS | Status: DC | PRN

## 2021-08-03 MED ORDER — ONDANSETRON HCL 4 MG/2ML IJ SOLN: 4.0000 mg | INTRAMUSCULAR | Status: DC | PRN

## 2021-08-03 MED ORDER — ONDANSETRON HCL 4 MG/2ML IJ SOLN
4.0000 mg | Freq: Once | INTRAMUSCULAR | Status: AC
  Administered 2021-08-03: 4 mg via INTRAVENOUS
  Filled 2021-08-03: qty 2

## 2021-08-03 MED ORDER — ONDANSETRON HCL 4 MG/2ML IJ SOLN
INTRAMUSCULAR | Status: AC
  Filled 2021-08-03: qty 2

## 2021-08-03 MED ORDER — DIPHENHYDRAMINE HCL 12.5 MG/5ML PO ELIX: 12.5000 mg | ORAL_SOLUTION | Freq: Four times a day (QID) | ORAL | Status: DC | PRN

## 2021-08-03 MED ORDER — ACETAMINOPHEN 500 MG PO TABS: 1000.0000 mg | ORAL_TABLET | Freq: Four times a day (QID) | ORAL | Status: DC | PRN

## 2021-08-03 MED ORDER — DEXAMETHASONE SODIUM PHOSPHATE 10 MG/ML IJ SOLN
INTRAMUSCULAR | Status: AC
  Filled 2021-08-03: qty 1

## 2021-08-03 MED ORDER — MIDAZOLAM HCL 5 MG/5ML IJ SOLN
INTRAMUSCULAR | Status: DC | PRN
  Administered 2021-08-03: 2 mg via INTRAVENOUS

## 2021-08-03 SURGICAL SUPPLY — 15 items
BAG URO CATCHER STRL LF (MISCELLANEOUS) ×3 IMPLANT
CATH URETL OPEN END 6FR 70 (CATHETERS) ×3 IMPLANT
CLOTH BEACON ORANGE TIMEOUT ST (SAFETY) ×3 IMPLANT
GLOVE SURG ENC MOIS LTX SZ6.5 (GLOVE) ×3 IMPLANT
GLOVE SURG ENC MOIS LTX SZ7 (GLOVE) ×3 IMPLANT
GLOVE SURG ENC MOIS LTX SZ7.5 (GLOVE) ×3 IMPLANT
GLOVE SURG LTX SZ7 (GLOVE) ×3 IMPLANT
GOWN STRL REUS W/ TWL LRG LVL3 (GOWN DISPOSABLE) ×4 IMPLANT
GOWN STRL REUS W/TWL LRG LVL3 (GOWN DISPOSABLE) ×12 IMPLANT
GUIDEWIRE STR DUAL SENSOR (WIRE) ×3 IMPLANT
MANIFOLD NEPTUNE II (INSTRUMENTS) ×3 IMPLANT
PACK CYSTO (CUSTOM PROCEDURE TRAY) ×3 IMPLANT
STENT URET 6FRX26 CONTOUR (STENTS) ×3 IMPLANT
TUBING CONNECTING 10 (TUBING) ×3 IMPLANT
TUBING UROLOGY SET (TUBING) ×3 IMPLANT

## 2021-08-03 NOTE — ED Triage Notes (Signed)
The pt is c/o abd pain for one week with a temp

## 2021-08-03 NOTE — ED Notes (Signed)
Called [facility] about transferring pt to [facility] per PA

## 2021-08-03 NOTE — Consult Note (Signed)
I have been asked to see the patient by Dr. Carolan Shiver for evaluation and management of malpositioned right ureteral stent with hydronephrosis.  History of present illness: 50 year old man initially seen for right hydronephrosis without obstructing stone and signs of sepsis from UTI at the time who underwent urgent right ureteral stent placement.  Patient then returned to the operating room for diagnostic ureteroscopy was noted to have narrowing at the UPJ but no internal obstruction.  A stent was exchanged at that time and he was discharged home.  He now returns 3 weeks later with abdominal pain, nausea, vomiting and again UTI with signs of sepsis.  He initially presented to [facility] and has been transferred to [facility] for further urologic management.   Review of systems: A 12 point comprehensive review of systems was obtained and is negative unless otherwise stated in the history of present illness.  Patient Active Problem List   Diagnosis Date Noted   Sepsis (HCC) 06/18/2021   Kidney stone     No current facility-administered medications on file prior to encounter.   Current Outpatient Medications on File Prior to Encounter  Medication Sig Dispense Refill   senna-docusate (SENOKOT-S) 8.6-50 MG tablet Take 1 tablet by mouth at bedtime as needed for mild constipation. 30 tablet 0   sulfamethoxazole-trimethoprim (BACTRIM DS) 800-160 MG tablet Take 1 tablet by mouth every 12 (twelve) hours. (Patient not taking: Reported on 08/03/2021) 20 tablet 0   tamsulosin (FLOMAX) 0.4 MG CAPS capsule Take 1 capsule (0.4 mg total) by mouth daily. (Patient not taking: Reported on 08/03/2021) 30 capsule 1   tamsulosin (FLOMAX) 0.4 MG CAPS capsule Take 1 capsule (0.4 mg total) by mouth daily. (Patient not taking: Reported on 08/03/2021) 30 capsule 0   traMADol (ULTRAM) 50 MG tablet Take 1 tablet (50 mg total) by mouth every 6 (six) hours as needed. (Patient not taking: Reported on 08/03/2021) 20 tablet 0     Past Medical History:  Diagnosis Date   Hydronephrosis, right    Tuberculosis    when he was 50 years old he was treated for TB and was in the hospital for 4 months   Urinary pain     Past Surgical History:  Procedure Laterality Date   CYSTOSCOPY WITH RETROGRADE PYELOGRAM, URETEROSCOPY AND STENT PLACEMENT Right 06/18/2021   Procedure: CYSTOSCOPY WITH RETROGRADE PYELOGRAM, URETEROSCOPY AND STENT PLACEMENT;  Surgeon: Noel Christmas, MD;  Location[facility];  Service: Urology;  Laterality: Right;   CYSTOSCOPY WITH RETROGRADE PYELOGRAM, URETEROSCOPY AND STENT PLACEMENT Right 07/10/2021   Procedure: CYSTOSCOPY WITH RETROGRADE PYELOGRAM, DIAGNOSTIC URETEROSCOPY, BALLOON DILATION AND STENT EXCHANGE;  Surgeon: Noel Christmas, MD;  Location: [facility];  Service: Urology;  Laterality: Right;   I & D EXTREMITY  05/21/2012   Procedure: IRRIGATION AND DEBRIDEMENT EXTREMITY;  Surgeon: Dominica Severin, MD;  Location: [facility] OR;  Service: Orthopedics;  Laterality: Right;  with tendon repair    Social History   Tobacco Use   Smoking status: Never   Smokeless tobacco: Never  Substance Use Topics   Alcohol use: Not Currently   Drug use: No    No family history on file.  PE: Vitals:   08/03/21 1912 08/03/21 1917 08/03/21 2100  BP: 106/76  106/77  Pulse: (!) 138  (!) 112  Resp: 20  (!) 23  Temp: (!) 100.6 F (38.1 C)    TempSrc: Oral    SpO2: 96%  98%  Weight:  74.2 kg   Height:  5\' 4"  (1.626 m)    Patient appears to be in no acute distress  patient is alert and oriented x3 Atraumatic normocephalic head No cervical or supraclavicular lymphadenopathy appreciated No increased work of breathing, no audible wheezes/rhonchi Regular sinus rhythm/rate Abdomen is soft, nontender, nondistended, no CVA or suprapubic tenderness Lower extremities are symmetric without appreciable edema Grossly neurologically intact No identifiable skin lesions  Recent Labs    08/03/21 2000   WBC 13.6*  HGB 13.0  HCT 39.6   Recent Labs    08/03/21 2000  NA 133*  K 3.8  CL 99  CO2 21*  GLUCOSE 109*  BUN 11  CREATININE 1.18  CALCIUM 9.0   Recent Labs    08/03/21 2000  INR 1.1   No results for input(s): LABURIN in the last 72 hours. Results for orders placed or performed during the hospital encounter of 08/03/21  Resp Panel by RT-PCR (Flu A&B, Covid) Nasopharyngeal Swab     Status: None   Collection Time: 08/03/21  7:56 PM   Specimen: Nasopharyngeal Swab; Nasopharyngeal(NP) swabs in vial transport medium  Result Value Ref Range Status   SARS Coronavirus 2 by RT PCR NEGATIVE NEGATIVE Final    Comment: (NOTE) SARS-CoV-2 target nucleic acids are NOT DETECTED.  The SARS-CoV-2 RNA is generally detectable in upper respiratory specimens during the acute phase of infection. The lowest concentration of SARS-CoV-2 viral copies this assay can detect is 138 copies/mL. A negative result does not preclude SARS-Cov-2 infection and should not be used as the sole basis for treatment or other patient management decisions. A negative result may occur with  improper specimen collection/handling, submission of specimen other than nasopharyngeal swab, presence of viral mutation(s) within the areas targeted by this assay, and inadequate number of viral copies(<138 copies/mL). A negative result must be combined with clinical observations, patient history, and epidemiological information. The expected result is Negative.  Fact Sheet for Patients:  08/05/21  Fact Sheet for Healthcare Providers:  BloggerCourse.com  This test is no t yet approved or cleared by the [employer] FDA and  has been authorized for detection and/or diagnosis of SARS-CoV-2 by FDA under an Emergency Use Authorization (EUA). This EUA will remain  in effect (meaning this test can be used) for the duration of the COVID-19 declaration under Section  564(b)(1) of the Act, 21 U.S.C.section 360bbb-3(b)(1), unless the authorization is terminated  or revoked sooner.       Influenza A by PCR NEGATIVE NEGATIVE Final   Influenza B by PCR NEGATIVE NEGATIVE Final    Comment: (NOTE) The Xpert Xpress SARS-CoV-2/FLU/RSV plus assay is intended as an aid in the diagnosis of influenza from Nasopharyngeal swab specimens and should not be used as a sole basis for treatment. Nasal washings and aspirates are unacceptable for Xpert Xpress SARS-CoV-2/FLU/RSV testing.  Fact Sheet for Patients: [place]  Fact Sheet for Healthcare Providers: BloggerCourse.com  This test is not yet approved or cleared by the [employer] FDA and has been authorized for detection and/or diagnosis of SARS-CoV-2 by FDA under an Emergency Use Authorization (EUA). This EUA will remain in effect (meaning this test can be used) for the duration of the COVID-19 declaration under Section 564(b)(1) of the Act, 21 U.S.C. section 360bbb-3(b)(1), unless the authorization is terminated or revoked.  Performed at [facility], [address] [place], [place]     Imaging: CT Abd/Pelvis 08/03/21 IMPRESSION: Malpositioned right-sided endo ureteral stent with moderate severity right-sided hydronephrosis and hydroureter.  Electronically Signed   By: Aram Candela M.D.   On: 08/03/2021 20:37  Imp/Recommendations: Right hydronephrosis with malpositioned stent: -Given patient's vital signs concerning for early sepsis we will proceed directly to operating room for stent exchange -Urine culture pending and will continue broad-spectrum antibiotics -Patient will also likely need contrasted CT scan with contrast to look for crossing vessel /external obstruction which has been pending however can be done while he is in the hospital -to surgery tonight   Caspar Favila D Francois Elk

## 2021-08-03 NOTE — ED Provider Notes (Signed)
[facility] EMERGENCY DEPARTMENT Provider Note   CSN: [id number] Arrival date & time: 08/03/21  1834     History Chief Complaint  Patient presents with   Abdominal Pain    Jordan Patrick is a 50 y.o. male.  HPI Patient is a 50 year old male with a history of recurrent kidney stones who presents to the emergency department due to dysuria as well as abdominal pain.  Per records, patient recently admitted on October 10 and discharged on October 12.  Patient admitted for sepsis secondary to a complicated UTI.  During this admission he was started on ceftriaxone, Flomax, and evaluated by urology and they performed a cystoscopy and placed a right ureteral stent.  He was discharged on Bactrim and followed up outpatient with urology.  On November 1 patient had a cystoscopy with a retrograde pyelogram, diagnostic ureteroscopy, balloon dilation and stent exchange.  Patient states that since his recent admission he feels as if his symptoms have persisted.  Reports dysuria, right-sided abdominal pain, as well as right flank pain.  Reports intermittent nausea/vomiting and little p.o. intake due to this.  Feels that his pain is worsened recently and particularly worsens when coughing or urinating and will radiate towards his right testicle as well as his right flank.  Denies diarrhea.    Past Medical History:  Diagnosis Date   Hydronephrosis, right    Tuberculosis    when he was 50 years old he was treated for TB and was in the hospital for 4 months   Urinary pain     Patient Active Problem List   Diagnosis Date Noted   Sepsis (HCC) 06/18/2021   Kidney stone     Past Surgical History:  Procedure Laterality Date   CYSTOSCOPY WITH RETROGRADE PYELOGRAM, URETEROSCOPY AND STENT PLACEMENT Right 06/18/2021   Procedure: CYSTOSCOPY WITH RETROGRADE PYELOGRAM, URETEROSCOPY AND STENT PLACEMENT;  Surgeon: Noel Christmas, MD;  Location: [facility] OR;  Service: Urology;  Laterality:  Right;   CYSTOSCOPY WITH RETROGRADE PYELOGRAM, URETEROSCOPY AND STENT PLACEMENT Right 07/10/2021   Procedure: CYSTOSCOPY WITH RETROGRADE PYELOGRAM, DIAGNOSTIC URETEROSCOPY, BALLOON DILATION AND STENT EXCHANGE;  Surgeon: Noel Christmas, MD;  Location: [facility];  Service: Urology;  Laterality: Right;   I & D EXTREMITY  05/21/2012   Procedure: IRRIGATION AND DEBRIDEMENT EXTREMITY;  Surgeon: Dominica Severin, MD;  Location: [facility] OR;  Service: Orthopedics;  Laterality: Right;  with tendon repair       No family history on file.  Social History   Tobacco Use   Smoking status: Never   Smokeless tobacco: Never  Substance Use Topics   Alcohol use: Not Currently   Drug use: No    Home Medications Prior to Admission medications   Medication Sig Start Date End Date Taking? Authorizing Provider  senna-docusate (SENOKOT-S) 8.6-50 MG tablet Take 1 tablet by mouth at bedtime as needed for mild constipation. 06/20/21  Yes Meredeth Ide, MD  sulfamethoxazole-trimethoprim (BACTRIM DS) 800-160 MG tablet Take 1 tablet by mouth every 12 (twelve) hours. Patient not taking: Reported on 08/03/2021 06/20/21   Meredeth Ide, MD  tamsulosin (FLOMAX) 0.4 MG CAPS capsule Take 1 capsule (0.4 mg total) by mouth daily. Patient not taking: Reported on 08/03/2021 06/21/21   Meredeth Ide, MD  tamsulosin (FLOMAX) 0.4 MG CAPS capsule Take 1 capsule (0.4 mg total) by mouth daily. Patient not taking: Reported on 08/03/2021 07/10/21   Noel Christmas, MD  traMADol (ULTRAM) 50 MG tablet Take 1  tablet (50 mg total) by mouth every 6 (six) hours as needed. Patient not taking: Reported on 08/03/2021 07/10/21 07/10/22  Noel Christmas, MD   Allergies    Patient has no known allergies.  Review of Systems   Review of Systems  All other systems reviewed and are negative. Ten systems reviewed and are negative for acute change, except as noted in the HPI.   Physical Exam Updated Vital Signs BP 103/71    Pulse (!) 109   Temp (!) 100.6 F (38.1 C) (Oral)   Resp 19   Ht 5\' 4"  (1.626 m)   Wt 74.2 kg   SpO2 95%   BMI 28.08 kg/m   Physical Exam Vitals and nursing note reviewed.  Constitutional:      General: He is not in acute distress.    Appearance: Normal appearance. He is well-developed. He is not ill-appearing, toxic-appearing or diaphoretic.  HENT:     Head: Normocephalic and atraumatic.     Right Ear: External ear normal.     Left Ear: External ear normal.     Nose: Nose normal.     Mouth/Throat:     Mouth: Mucous membranes are moist.     Pharynx: Oropharynx is clear. No oropharyngeal exudate or posterior oropharyngeal erythema.  Eyes:     Extraocular Movements: Extraocular movements intact.  Cardiovascular:     Rate and Rhythm: Regular rhythm. Tachycardia present.     Pulses: Normal pulses.     Heart sounds: Normal heart sounds. No murmur heard.   No friction rub. No gallop.  Pulmonary:     Effort: Pulmonary effort is normal. No respiratory distress.     Breath sounds: Normal breath sounds. No stridor. No wheezing, rhonchi or rales.  Abdominal:     General: Abdomen is flat.     Palpations: Abdomen is soft.     Tenderness: There is abdominal tenderness. There is right CVA tenderness. There is no left CVA tenderness.     Comments: Abdomen is flat and soft.  Mild tenderness noted diffusely along the right side of the abdomen and right lateral abdomen.  Additional right CVA tenderness.  No left CVA tenderness.  Genitourinary:    Comments: Chaperone present.  Normal-appearing uncircumcised penis and testicles.  No tenderness appreciated along the penile shaft or the scrotal region.  No overlying skin changes. Musculoskeletal:        General: Normal range of motion.     Cervical back: Normal range of motion and neck supple. No tenderness.  Skin:    General: Skin is warm and dry.  Neurological:     General: No focal deficit present.     Mental Status: He is alert and  oriented to person, place, and time.  Psychiatric:        Mood and Affect: Mood normal.        Behavior: Behavior normal.   ED Results / Procedures / Treatments   Labs (all labs ordered are listed, but only abnormal results are displayed) Labs Reviewed  COMPREHENSIVE METABOLIC PANEL - Abnormal; Notable for the following components:      Result Value   Sodium 133 (*)    CO2 21 (*)    Glucose, Bld 109 (*)    Albumin 3.3 (*)    All other components within normal limits  CBC WITH DIFFERENTIAL/PLATELET - Abnormal; Notable for the following components:   WBC 13.6 (*)    Platelets 426 (*)    Neutro Abs 10.7 (*)  Monocytes Absolute 1.3 (*)    All other components within normal limits  URINALYSIS, ROUTINE W REFLEX MICROSCOPIC - Abnormal; Notable for the following components:   Color, Urine AMBER (*)    APPearance CLOUDY (*)    Hgb urine dipstick MODERATE (*)    Ketones, ur 80 (*)    Protein, ur 100 (*)    Leukocytes,Ua LARGE (*)    RBC / HPF >50 (*)    WBC, UA >50 (*)    Bacteria, UA MANY (*)    All other components within normal limits  RESP PANEL BY RT-PCR (FLU A&B, COVID) ARPGX2  CULTURE, BLOOD (ROUTINE X 2)  CULTURE, BLOOD (ROUTINE X 2)  URINE CULTURE  LACTIC ACID, PLASMA  LACTIC ACID, PLASMA  PROTIME-INR  APTT   EKG None  Radiology DG Chest Port 1 View  Result Date: 08/03/2021 CLINICAL DATA:  Questionable sepsis. EXAM: PORTABLE CHEST 1 VIEW COMPARISON:  Chest radiograph dated 07/17/2020. FINDINGS: The heart size and mediastinal contours are within normal limits. Both lungs are clear. The visualized skeletal structures are unremarkable. IMPRESSION: No active disease. Electronically Signed   By: Elgie Collard M.D.   On: 08/03/2021 20:13   CT Renal Stone Study  Result Date: 08/03/2021 CLINICAL DATA:  Abdominal pain. EXAM: CT ABDOMEN AND PELVIS WITHOUT CONTRAST TECHNIQUE: Multidetector CT imaging of the abdomen and pelvis was performed following the standard  protocol without IV contrast. COMPARISON:  June 18, 2021 FINDINGS: Lower chest: No acute abnormality. Hepatobiliary: No focal liver abnormality is seen. No gallstones, gallbladder wall thickening, or biliary dilatation. Pancreas: Unremarkable. No pancreatic ductal dilatation or surrounding inflammatory changes. Spleen: Normal in size without focal abnormality. Adrenals/Urinary Tract: Adrenal glands are unremarkable. Kidneys are normal in size, without focal lesions. A right-sided endo ureteral stent is in place. The proximal portion of the stent is seen within the proximal right ureter. The distal aspect of the stent is seen within the lumen of the urinary bladder. Moderate severity right-sided hydronephrosis and hydroureter are seen to the level of the proximal portion of the endo ureteral stent. Bladder is unremarkable. Stomach/Bowel: Stomach is within normal limits. Appendix appears normal. No evidence of bowel wall thickening, distention, or inflammatory changes. Vascular/Lymphatic: No significant vascular findings are present. No enlarged abdominal or pelvic lymph nodes. Reproductive: Prostate is unremarkable. Other: No abdominal wall hernia or abnormality. No abdominopelvic ascites. Musculoskeletal: No acute or significant osseous findings. IMPRESSION: Malpositioned right-sided endo ureteral stent with moderate severity right-sided hydronephrosis and hydroureter. Electronically Signed   By: Aram Candela M.D.   On: 08/03/2021 20:37    Procedures Procedures   Medications Ordered in ED Medications  acetaminophen (TYLENOL) tablet 1,000 mg (has no administration in time range)  ondansetron (ZOFRAN) injection 4 mg (4 mg Intravenous Given 08/03/21 2209)  morphine 4 MG/ML injection 4 mg (4 mg Intravenous Given 08/03/21 2209)  lactated ringers bolus 2,250 mL (2,250 mLs Intravenous New Bag/Given 08/03/21 2208)  cefTRIAXone (ROCEPHIN) 2 g in sodium chloride 0.9 % 100 mL IVPB (2 g Intravenous New  Bag/Given 08/03/21 2209)    ED Course  I have reviewed the triage vital signs and the nursing notes.  Pertinent labs & imaging results that were available during my care of the patient were reviewed by me and considered in my medical decision making (see chart for details).  Clinical Course as of 08/03/21 2302  Fri Aug 03, 2021  2130 WBC, UA(!): >50 [LJ]  2130 Leukocytes,Ua(!): LARGE [LJ]  2130 Bacteria, UA(!): MANY [LJ]  2248 Patient discussed with Dr. Arita Miss.  Given patient's apparent urosepsis and malpositioned stent he will need to go to the OR tonight.  Request that he be sent to the OR.  Discussed with [facility] and patient will be sent directly to the PACU for his procedure tonight.  We will discuss with the medicine team for medicine admission after his procedure. [LJ]    Clinical Course User Index [LJ] Placido Sou, PA-C   MDM Rules/Calculators/A&P                          Pt is a 50 y.o. male with history of recurrent kidney stones and right-year-old stent placement who presents to the emergency department due to fevers, nausea, vomiting, right-sided abdominal pain, dysuria.  Labs: CBC with a white count of 13.6, platelets of 426, neutrophils of 10.7, monocytes of 1.3. CMP with a sodium of 133, CO2 of 21, glucose of 109, albumin of 3.3. UA with moderate hemoglobin, 80 ketones, 100 protein, large leukocytes, greater than 50 red blood cells, greater than 50 white blood cells, many bacteria. Respiratory panel is negative.  Imaging: Chest x-ray is negative. CT renal stone study shows malpositioned right-sided no ureteral stent with moderate severity right-sided hydronephrosis and hydroureter.  I, Placido Sou, PA-C, personally reviewed and evaluated these images and lab results as part of my medical decision-making.  UA appears infectious.  Patient febrile and tachycardic.  Appear septic.  Patient started on IV fluids, Tylenol, Rocephin, morphine, as well as Zofran.   Patient is followed by Dr. Arita Miss with urology.  Patient discussed with Dr. Arita Miss who will take the patient to the OR emergently at [facility].  Our staff reached out to [facility] and patient is going to be transferred directly to the PACU at [facility] for an emergent procedure tonight.  Patient has been transferred.  Patient discussed with the medicine team.  They will evaluate the patient after his procedure and likely admit to medicine.  Note: Portions of this report may have been transcribed using voice recognition software. Every effort was made to ensure accuracy; however, inadvertent computerized transcription errors may be present.   Final Clinical Impression(s) / ED Diagnoses Final diagnoses:  Sepsis, due to unspecified organism, unspecified whether acute organ dysfunction present [facility])  Displacement of ureteral stent, initial encounter [facility])   Rx / DC Orders ED Discharge Orders     None        Placido Sou, PA-C 08/03/21 2304    Pollyann Savoy, MD 08/03/21 [phone]

## 2021-08-03 NOTE — Anesthesia Preprocedure Evaluation (Signed)
Anesthesia Evaluation  Patient identified by MRN, date of birth, ID band Patient awake    Reviewed: Allergy & Precautions, NPO status , Patient's Chart, lab work & pertinent test results  Airway Mallampati: II  TM Distance: >3 FB Neck ROM: Full    Dental  (+) Teeth Intact, Dental Advisory Given   Pulmonary    breath sounds clear to auscultation       Cardiovascular negative cardio ROS   Rhythm:Regular Rate:Normal     Neuro/Psych    GI/Hepatic negative GI ROS, Neg liver ROS,   Endo/Other  negative endocrine ROS  Renal/GU Renal disease     Musculoskeletal negative musculoskeletal ROS (+)   Abdominal Normal abdominal exam  (+)   Peds  Hematology negative hematology ROS (+)   Anesthesia Other Findings   Reproductive/Obstetrics                             Anesthesia Physical Anesthesia Plan  ASA: 2 and emergent  Anesthesia Plan: General   Post-op Pain Management:    Induction: Rapid sequence and Cricoid pressure planned  PONV Risk Score and Plan: 3 and Ondansetron, Dexamethasone and Midazolam  Airway Management Planned: Oral ETT  Additional Equipment: None  Intra-op Plan:   Post-operative Plan: Extubation in OR  Informed Consent: I have reviewed the patients History and Physical, chart, labs and discussed the procedure including the risks, benefits and alternatives for the proposed anesthesia with the patient or authorized representative who has indicated his/her understanding and acceptance.     Dental advisory given  Plan Discussed with: CRNA  Anesthesia Plan Comments:         Anesthesia Quick Evaluation

## 2021-08-03 NOTE — ED Provider Notes (Signed)
Emergency Medicine Provider Triage Evaluation Note  Jordan Patrick , a 50 y.o. male  was evaluated in triage.  Pt complains of bilateral flank pain, fevers, chills, body aches, pain with urination, generalized weakness and feeling that he is going to die.  History of renal stenting secondary to infected stones a few weeks ago.  Review of Systems  Positive: Fevers, chills, body aches, pain with urination, weakness, nausea, vomiting with NBNB emesis Negative: Diarrhea  Physical Exam  BP 106/76 (BP Location: Right Arm)   Pulse (!) 138   Temp (!) 100.6 F (38.1 C) (Oral)   Resp 20   Ht 5\' 4"  (1.626 m)   Wt 74.2 kg   SpO2 96%   BMI 28.08 kg/m  Gen:   Awake, ill-appearing Resp:  Normal effort  MSK:   Moves extremities without difficulty  Other:  Mild tenderness palpation in the suprapubic area.  No CVAT.  Tachycardic.  Medical Decision Making  Medically screening exam initiated at 7:35 PM.  Appropriate orders placed.  [name] was informed that the remainder of the evaluation will be completed by another provider, this initial triage assessment does not replace that evaluation, and the importance of remaining in the ED until their evaluation is complete.  Patient to be roomed next, RN aware.  This chart was dictated using voice recognition software, Dragon. Despite the best efforts of this provider to proofread and correct errors, errors may still occur which can change documentation meaning.    Wynelle Bourgeois 08/03/21 1949    08/05/21, MD 08/03/21 2028

## 2021-08-04 ENCOUNTER — Other Ambulatory Visit: Payer: Self-pay

## 2021-08-04 ENCOUNTER — Encounter (HOSPITAL_COMMUNITY): Payer: Self-pay | Admitting: Urology

## 2021-08-04 ENCOUNTER — Observation Stay (HOSPITAL_COMMUNITY): Payer: Self-pay

## 2021-08-04 DIAGNOSIS — N1330 Unspecified hydronephrosis: Secondary | ICD-10-CM

## 2021-08-04 DIAGNOSIS — T83122A Displacement of urinary stent, initial encounter: Secondary | ICD-10-CM

## 2021-08-04 DIAGNOSIS — A419 Sepsis, unspecified organism: Secondary | ICD-10-CM

## 2021-08-04 LAB — BASIC METABOLIC PANEL
Anion gap: 11 (ref 5–15)
BUN: 12 mg/dL (ref 6–20)
CO2: 23 mmol/L (ref 22–32)
Calcium: 8.5 mg/dL — ABNORMAL LOW (ref 8.9–10.3)
Chloride: 98 mmol/L (ref 98–111)
Creatinine, Ser: 1.01 mg/dL (ref 0.61–1.24)
GFR, Estimated: >60 mL/min (ref >60)
Glucose, Bld: 143 mg/dL — ABNORMAL HIGH (ref 70–99)
Potassium: 3.6 mmol/L (ref 3.5–5.1)
Sodium: 132 mmol/L — ABNORMAL LOW (ref 135–145)

## 2021-08-04 LAB — CBC
HCT: 33.1 % — ABNORMAL LOW (ref 39.0–52.0)
Hemoglobin: 11.2 g/dL — ABNORMAL LOW (ref 13.0–17.0)
MCH: 30.3 pg (ref 26.0–34.0)
MCHC: 33.8 g/dL (ref 30.0–36.0)
MCV: 89.5 fL (ref 80.0–100.0)
Platelets: 338 10*3/uL (ref 150–400)
RBC: 3.7 MIL/uL — ABNORMAL LOW (ref 4.22–5.81)
RDW: 12.6 % (ref 11.5–15.5)
WBC: 18.2 10*3/uL — ABNORMAL HIGH (ref 4.0–10.5)
nRBC: 0 % (ref 0.0–0.2)

## 2021-08-04 MED ORDER — PHENYLEPHRINE 40 MCG/ML (10ML) SYRINGE FOR IV PUSH (FOR BLOOD PRESSURE SUPPORT)
PREFILLED_SYRINGE | INTRAVENOUS | Status: AC
  Filled 2021-08-04: qty 10

## 2021-08-04 MED ORDER — PHENYLEPHRINE HCL (PRESSORS) 10 MG/ML IV SOLN
INTRAVENOUS | Status: DC | PRN
  Administered 2021-08-04 (×2): 120 ug via INTRAVENOUS

## 2021-08-04 MED ORDER — 0.9 % SODIUM CHLORIDE (POUR BTL) OPTIME
TOPICAL | Status: DC | PRN
  Administered 2021-08-04: 1000 mL

## 2021-08-04 MED ORDER — ACETAMINOPHEN 160 MG/5ML PO SOLN: 325.0000 mg | ORAL | Status: DC | PRN

## 2021-08-04 MED ORDER — IOHEXOL 300 MG/ML  SOLN
INTRAMUSCULAR | Status: DC | PRN
  Administered 2021-08-04: 10 mL via URETHRAL

## 2021-08-04 MED ORDER — ENSURE ENLIVE PO LIQD
237.0000 mL | Freq: Two times a day (BID) | ORAL | Status: DC
  Administered 2021-08-04 – 2021-08-07 (×6): 237 mL via ORAL

## 2021-08-04 MED ORDER — AMISULPRIDE (ANTIEMETIC) 5 MG/2ML IV SOLN: 10.0000 mg | Freq: Once | INTRAVENOUS | Status: DC | PRN

## 2021-08-04 MED ORDER — HEPARIN SODIUM (PORCINE) 5000 UNIT/ML IJ SOLN
5000.0000 [IU] | Freq: Three times a day (TID) | INTRAMUSCULAR | Status: DC
  Administered 2021-08-04 – 2021-08-07 (×10): 5000 [IU] via SUBCUTANEOUS
  Filled 2021-08-04 (×10): qty 1

## 2021-08-04 MED ORDER — TAMSULOSIN HCL 0.4 MG PO CAPS
0.4000 mg | ORAL_CAPSULE | Freq: Every day | ORAL | Status: DC
  Administered 2021-08-04 – 2021-08-07 (×4): 0.4 mg via ORAL
  Filled 2021-08-04 (×4): qty 1

## 2021-08-04 MED ORDER — FENTANYL CITRATE PF 50 MCG/ML IJ SOSY: 25.0000 ug | PREFILLED_SYRINGE | INTRAMUSCULAR | Status: DC | PRN

## 2021-08-04 MED ORDER — OXYCODONE HCL 5 MG PO TABS: 5.0000 mg | ORAL_TABLET | Freq: Once | ORAL | Status: DC | PRN

## 2021-08-04 MED ORDER — SODIUM CHLORIDE 0.9 % IV SOLN
2.0000 g | INTRAVENOUS | Status: DC
  Administered 2021-08-04 – 2021-08-05 (×2): 2 g via INTRAVENOUS
  Filled 2021-08-04 (×2): qty 20

## 2021-08-04 MED ORDER — SODIUM CHLORIDE 0.9 % IV SOLN: INTRAVENOUS | Status: DC

## 2021-08-04 MED ORDER — SODIUM CHLORIDE 0.9 % IR SOLN
Status: DC | PRN
  Administered 2021-08-04: 3000 mL

## 2021-08-04 MED ORDER — PROMETHAZINE HCL 25 MG/ML IJ SOLN: 6.2500 mg | INTRAMUSCULAR | Status: DC | PRN

## 2021-08-04 MED ORDER — ACETAMINOPHEN 325 MG PO TABS: 325.0000 mg | ORAL_TABLET | ORAL | Status: DC | PRN

## 2021-08-04 MED ORDER — OXYCODONE HCL 5 MG/5ML PO SOLN: 5.0000 mg | Freq: Once | ORAL | Status: DC | PRN

## 2021-08-04 MED ORDER — ACETAMINOPHEN 10 MG/ML IV SOLN: 1000.0000 mg | Freq: Once | INTRAVENOUS | Status: DC | PRN

## 2021-08-04 NOTE — Anesthesia Procedure Notes (Signed)
Procedure Name: Intubation Date/Time: 08/04/2021 11:53 PM Performed by: Sudie Grumbling, CRNA Pre-anesthesia Checklist: Patient identified, Emergency Drugs available, Suction available and Patient being monitored Patient Re-evaluated:Patient Re-evaluated prior to induction Oxygen Delivery Method: Circle system utilized Preoxygenation: Pre-oxygenation with 100% oxygen Induction Type: IV induction Ventilation: Mask ventilation without difficulty Laryngoscope Size: Miller and 2 Grade View: Grade I Tube type: Oral Tube size: 7.5 mm Number of attempts: 1 Airway Equipment and Method: Stylet and Oral airway Placement Confirmation: ETT inserted through vocal cords under direct vision, positive ETCO2 and breath sounds checked- equal and bilateral Secured at: 22 cm Tube secured with: Tape Dental Injury: Teeth and Oropharynx as per pre-operative assessment

## 2021-08-04 NOTE — Op Note (Signed)
Preoperative diagnosis:  Right hydronephrosis with malpositioned ureteral stent UTI with sepsis  Postoperative diagnosis:  Same  Procedure:  Cystoscopy right ureteral stent exchange (6Fr x 26 cm no tether) right retrograde pyelography with interpretation  Right diagnostic ureteroscopy  Surgeon: Kasandra Knudsen, MD  Anesthesia: General  Complications: None  Intraoperative findings:   Normal anterior urethra Lateral lobe prostatic hypertrophy Orthotropic Uos Right ureteral stent with redundancy in bladder confirming migration right retrograde pyelography demonstrated severe hydronephrosis without filling defect Repositioned 6Fr x 26cm JJ stent no tether  EBL: Minimal  Specimens: None  Indication: Jordan Patrick is a 50 y.o. patient who initially presented with right hydronephrosis associate with urinary tract infection and signs of sepsis.  No ureteral calculus note time.  Patient underwent urgent ureteral stent placement and improved clinically.  He then underwent diagnostic ureteroscopy which did not show any internal obstruction and stent exchange.  He now returns 3 weeks later with persistent abdominal pain, nausea, vomiting and now fever concerning for UTI with early sepsis.  CT showed malpositioned right ureteral stent with hydronephrosis.  After reviewing the management options for treatment, he elected to proceed with the above surgical procedure(s). We have discussed the potential benefits and risks of the procedure, side effects of the proposed treatment, the likelihood of the patient achieving the goals of the procedure, and any potential problems that might occur during the procedure or recuperation. Informed consent has been obtained.  Description of procedure:  The patient was taken to the operating room and general anesthesia was induced.  The patient was placed in the dorsal lithotomy position, prepped and draped in the usual sterile fashion, and preoperative  antibiotics were administered. A preoperative time-out was performed.   Cystourethroscopy was performed.  The patient's urethra was examined and l  demonstrated bilobar prostatic hypertrophy. The bladder was then systematically examined in its entirety. There was no evidence for any bladder tumors, stones, or other mucosal pathology.    Attention then turned to the right ureteral orifice and graspers were used to bring the existing stent to the urethral meatus.  A 0.38 sensor wire was then advanced through the stent into the ureter.  Next an open-ended ureteral catheter was placed over the wire and advanced into the ureter.  A retrograde pyelogram was then obtained.  This showed severe hydronephrosis without specific filling defect.  There was a narrowing in the proximal ureter.  Attempts at placing the wire back through the ureteral catheter were attempted however it did not appear to traverse the narrowing into the renal pelvis.  The ureteral catheter was removed and the wire kept in place in the mid ureter.   A flexible ureteroscope was then advanced over the wire into the mid ureter.  The wire was removed.  Flexible ureteroscopy continued to the UPJ.  Again there was edema and narrowing in the proximal area but no clear obstruction.  The flexible ureteroscope was able to easily traversed into the renal pelvis.  Once in the renal pelvis a 0.3 sensor wire was then placed through the ureteroscope and the ureteroscope was removed.   The wire was then backloaded through the cystoscope and a ureteral stent was advance over the wire using Seldinger technique.  The stent was positioned appropriately under fluoroscopic and cystoscopic guidance.  The wire was then removed with an adequate stent curl noted in the renal pelvis as well as in the bladder.  The bladder was then emptied and the procedure ended.  The patient appeared  to tolerate the procedure well and without complications.  The patient was able to be  awakened and transferred to the recovery unit in satisfactory condition.   Plan: The patient will be admitted for continued observation and antibiotics.  He will need a contrasted scan to look for crossing vessel and UPJ obstruction.  Kasandra Knudsen, M.D.

## 2021-08-04 NOTE — Assessment & Plan Note (Addendum)
Present on admission. Secondary to UTI. Empirically started on Ceftriaxone. Blood cultures with no growth to date. Urine culture with enterobacter aerogenes. Leukocytosis improved with antibiotics. Urine culture significant for enterobacter aerogenes. Patient transitioned to Ciprofloxacin to complete a 7 day course of antibiotics, however he developed an allergic reaction to Ciprofloxacin. Ciprofloxacin switched to Bactrim DS.

## 2021-08-04 NOTE — Progress Notes (Signed)
PROGRESS NOTE    Jordan Patrick  SPQ:[id number] DOB: [birth year] DOA: 08/03/2021 PCP: Oneita Hurt, No   Brief Narrative: Jordan Patrick is a 50 y.o. male with recent history of right ureteral stent placement secondary to hydronephrosis. Patient presented secondary to nausea/vomiting and abdominal discomfort with evidence of malpositioned right ureteral stent with associated hydronephrosis/hydroureter in addition to UTI. Empiric antibiotics initiated and patient had stent exchange performed by Urology.   Assessment & Plan:   * Sepsis (HCC) Present on admission. Secondary to UTI. Empirically started on Ceftriaxone. Follow up blood and urine cultures. -Continue Ceftriaxone and follow-up cultures  Displacement of ureteral stent [facility]) Urology. Right ureteral stent exchanged on 11/26.  UTI (urinary tract infection) In setting of right ureteral stent. Patient started empirically on Ceftriaxone as mentioned above. -Continue Ceftriaxone -Follow-up urine culture sensitivities  Hydronephrosis of right kidney In setting of displaced right ureteral stent. Exchange performed as mentioned above.     DVT prophylaxis: Heparin subq Code Status:   Code Status: Full Code Family Communication: None at bedside Disposition Plan: Discharge home likely in 2-3 days   Consultants:  Urology  Procedures:  UROLOGY PROCEDURE (08/04/2021) Cystoscopy right ureteral stent exchange (6Fr x 26 cm no tether) right retrograde pyelography with interpretation  Right diagnostic ureteroscopy   Antimicrobials: Ceftriaxone IV    Subjective: Some abdominal pain but otherwise feels well.  Objective: Vitals:   08/04/21 0204 08/04/21 0300 08/04/21 0408 08/04/21 0558  BP: 106/72 94/64 92/66  97/68  Pulse: (!) 127 (!) 101 90 77  Resp: 18 16 16    Temp: (!) 102.9 F (39.4 C) 100 F (37.8 C) 97.9 F (36.6 C) (!) 97.2 F (36.2 C)  TempSrc: Oral Oral Oral Oral  SpO2: 95% 97% 97% 98%  Weight:       Height:        Intake/Output Summary (Last 24 hours) at 08/04/2021 0729 Last data filed at 08/04/2021 0559 Gross per 24 hour  Intake 113.38 ml  Output 1200 ml  Net -1086.62 ml   Filed Weights   08/03/21 1917  Weight: 74.2 kg    Examination:  General exam: Appears calm and comfortable Respiratory system: Clear to auscultation. Respiratory effort normal. Cardiovascular system: S1 & S2 heard, RRR. No murmurs, rubs, gallops or clicks. Gastrointestinal system: Abdomen is nondistended, soft and nontender. No organomegaly or masses felt. Normal bowel sounds heard. Central nervous system: Alert and oriented. No focal neurological deficits. Musculoskeletal: No edema. No calf tenderness Skin: No cyanosis. No rashes Psychiatry: Judgement and insight appear normal. Mood & affect appropriate.     Data Reviewed: I have personally reviewed following labs and imaging studies  CBC Lab Results  Component Value Date   WBC 18.2 (H) 08/04/2021   RBC 3.70 (L) 08/04/2021   HGB 11.2 (L) 08/04/2021   HCT 33.1 (L) 08/04/2021   MCV 89.5 08/04/2021   MCH 30.3 08/04/2021   PLT 338 08/04/2021   MCHC 33.8 08/04/2021   RDW 12.6 08/04/2021   LYMPHSABS 1.5 08/03/2021   MONOABS 1.3 (H) 08/03/2021   EOSABS 0.0 08/03/2021   BASOSABS 0.0 08/03/2021     Last metabolic panel Lab Results  Component Value Date   NA 132 (L) 08/04/2021   K 3.6 08/04/2021   CL 98 08/04/2021   CO2 23 08/04/2021   BUN 12 08/04/2021   CREATININE 1.01 08/04/2021   GLUCOSE 143 (H) 08/04/2021   GFRNONAA >60 08/04/2021   GFRAA >90 05/21/2012   CALCIUM 8.5 (L) 08/04/2021  PROT 7.8 08/03/2021   ALBUMIN 3.3 (L) 08/03/2021   BILITOT 1.0 08/03/2021   ALKPHOS 73 08/03/2021   AST 15 08/03/2021   ALT 12 08/03/2021   ANIONGAP 11 08/04/2021    CBG (last 3)  No results for input(s): GLUCAP in the last 72 hours.   GFR: Estimated Creatinine Clearance: 82.5 mL/min (by C-G formula based on SCr of 1.01  mg/dL).  Coagulation Profile: Recent Labs  Lab 08/03/21 2000  INR 1.1    Recent Results (from the past 240 hour(s))  Resp Panel by RT-PCR (Flu A&B, Covid) Nasopharyngeal Swab     Status: None   Collection Time: 08/03/21  7:56 PM   Specimen: Nasopharyngeal Swab; Nasopharyngeal(NP) swabs in vial transport medium  Result Value Ref Range Status   SARS Coronavirus 2 by RT PCR NEGATIVE NEGATIVE Final    Comment: (NOTE) SARS-CoV-2 target nucleic acids are NOT DETECTED.  The SARS-CoV-2 RNA is generally detectable in upper respiratory specimens during the acute phase of infection. The lowest concentration of SARS-CoV-2 viral copies this assay can detect is 138 copies/mL. A negative result does not preclude SARS-Cov-2 infection and should not be used as the sole basis for treatment or other patient management decisions. A negative result may occur with  improper specimen collection/handling, submission of specimen other than nasopharyngeal swab, presence of viral mutation(s) within the areas targeted by this assay, and inadequate number of viral copies(<138 copies/mL). A negative result must be combined with clinical observations, patient history, and epidemiological information. The expected result is Negative.  Fact Sheet for Patients:  BloggerCourse.com  Fact Sheet for Healthcare Providers:  SeriousBroker.it  This test is no t yet approved or cleared by the [place] FDA and  has been authorized for detection and/or diagnosis of SARS-CoV-2 by FDA under an Emergency Use Authorization (EUA). This EUA will remain  in effect (meaning this test can be used) for the duration of the COVID-19 declaration under Section 564(b)(1) of the Act, 21 U.S.C.section 360bbb-3(b)(1), unless the authorization is terminated  or revoked sooner.       Influenza A by PCR NEGATIVE NEGATIVE Final   Influenza B by PCR NEGATIVE NEGATIVE Final     Comment: (NOTE) The Xpert Xpress SARS-CoV-2/FLU/RSV plus assay is intended as an aid in the diagnosis of influenza from Nasopharyngeal swab specimens and should not be used as a sole basis for treatment. Nasal washings and aspirates are unacceptable for Xpert Xpress SARS-CoV-2/FLU/RSV testing.  Fact Sheet for Patients: BloggerCourse.com  Fact Sheet for Healthcare Providers: SeriousBroker.it  This test is not yet approved or cleared by the [place] FDA and has been authorized for detection and/or diagnosis of SARS-CoV-2 by FDA under an Emergency Use Authorization (EUA). This EUA will remain in effect (meaning this test can be used) for the duration of the COVID-19 declaration under Section 564(b)(1) of the Act, 21 U.S.C. section 360bbb-3(b)(1), unless the authorization is terminated or revoked.  Performed at [facility], [address] [place], [place] [address]         Radiology Studies: DG Chest Port 1 View  Result Date: 08/03/2021 CLINICAL DATA:  Questionable sepsis. EXAM: PORTABLE CHEST 1 VIEW COMPARISON:  Chest radiograph dated 07/17/2020. FINDINGS: The heart size and mediastinal contours are within normal limits. Both lungs are clear. The visualized skeletal structures are unremarkable. IMPRESSION: No active disease. Electronically Signed   By: Elgie Collard M.D.   On: 08/03/2021 20:13   DG C-Arm 1-60 Min-No Report  Result Date:  08/04/2021 Fluoroscopy was utilized by the requesting physician.  No radiographic interpretation.   CT Renal Stone Study  Result Date: 08/03/2021 CLINICAL DATA:  Abdominal pain. EXAM: CT ABDOMEN AND PELVIS WITHOUT CONTRAST TECHNIQUE: Multidetector CT imaging of the abdomen and pelvis was performed following the standard protocol without IV contrast. COMPARISON:  June 18, 2021 FINDINGS: Lower chest: No acute abnormality. Hepatobiliary: No focal liver abnormality is seen. No  gallstones, gallbladder wall thickening, or biliary dilatation. Pancreas: Unremarkable. No pancreatic ductal dilatation or surrounding inflammatory changes. Spleen: Normal in size without focal abnormality. Adrenals/Urinary Tract: Adrenal glands are unremarkable. Kidneys are normal in size, without focal lesions. A right-sided endo ureteral stent is in place. The proximal portion of the stent is seen within the proximal right ureter. The distal aspect of the stent is seen within the lumen of the urinary bladder. Moderate severity right-sided hydronephrosis and hydroureter are seen to the level of the proximal portion of the endo ureteral stent. Bladder is unremarkable. Stomach/Bowel: Stomach is within normal limits. Appendix appears normal. No evidence of bowel wall thickening, distention, or inflammatory changes. Vascular/Lymphatic: No significant vascular findings are present. No enlarged abdominal or pelvic lymph nodes. Reproductive: Prostate is unremarkable. Other: No abdominal wall hernia or abnormality. No abdominopelvic ascites. Musculoskeletal: No acute or significant osseous findings. IMPRESSION: Malpositioned right-sided endo ureteral stent with moderate severity right-sided hydronephrosis and hydroureter. Electronically Signed   By: Aram Candela M.D.   On: 08/03/2021 20:37        Scheduled Meds:  docusate sodium  100 mg Oral BID   feeding supplement  237 mL Oral BID BM   heparin  5,000 Units Subcutaneous Q8H   senna  1 tablet Oral BID   tamsulosin  0.4 mg Oral Daily   Continuous Infusions:  sodium chloride 75 mL/hr at 08/04/21 0130   cefTRIAXone (ROCEPHIN)  IV       LOS: 0 days     Jacquelin Hawking, MD [facility] 08/04/2021, 7:29 AM  If 7PM-7AM, please contact night-coverage [email or web address]

## 2021-08-04 NOTE — Progress Notes (Signed)
   08/04/21 0204  Vitals  Temp (!) 102.9 F (39.4 C)  Temp Source Oral  BP 106/72  MAP (mmHg) 83  BP Location Left Arm  BP Method Automatic  Patient Position (if appropriate) Lying  Pulse Rate (!) 127  Pulse Rate Source Monitor  Resp 18  MEWS COLOR  MEWS Score Color Red  Oxygen Therapy  SpO2 95 %  O2 Device Nasal Cannula  O2 Flow Rate (L/min) 2 L/min  MEWS Score  MEWS Temp 2  MEWS Systolic 0  MEWS Pulse 2  MEWS RR 0  MEWS LOC 0  MEWS Score 4   Pt given tylenol at 0109.  Ice packs applied at 0205. Temp recheck 100. Will continue to monitor.

## 2021-08-04 NOTE — H&P (Signed)
History and Physical    Jordan Patrick ASN:[id number] DOB: 1970/12/05 DOA: 08/03/2021  PCP: Pcp, No  Patient coming from: Home.  Engineer, structural used.  Chief Complaint: Nausea vomiting abdominal discomfort.  HPI: Jordan Patrick is a 50 y.o. male with history of stent placement for right-sided hydronephrosis in October 1 week subsequent which stent was exchanged 3 weeks ago presents to the ER because of persistent pain radiating from the right flank to the groin and nausea vomiting.  Unable to keep anything.  Patient also has been having fever chills.  ED Course: In the ER patient was tachycardic with fever 101 F UA is concerning for UTI patient's presentation was concerning for sepsis CT renal study shows malpositioned right ureteral stent with hydronephrosis and hydroureter.  Patient was taken to the OR immediately because of the septic picture and had stent reposition and was started on antibiotics fluids admitted for further work-up.  COVID test was negative.  Labs show normal lactic acid and WBC of 13.6.  Review of Systems: As per HPI, rest all negative.   Past Medical History:  Diagnosis Date   Hydronephrosis, right    Tuberculosis    when he was 50 years old he was treated for TB and was in the hospital for 4 months   Urinary pain     Past Surgical History:  Procedure Laterality Date   CYSTOSCOPY WITH RETROGRADE PYELOGRAM, URETEROSCOPY AND STENT PLACEMENT Right 06/18/2021   Procedure: CYSTOSCOPY WITH RETROGRADE PYELOGRAM, URETEROSCOPY AND STENT PLACEMENT;  Surgeon: Noel Christmas, MD;  Location: [facility] OR;  Service: Urology;  Laterality: Right;   CYSTOSCOPY WITH RETROGRADE PYELOGRAM, URETEROSCOPY AND STENT PLACEMENT Right 07/10/2021   Procedure: CYSTOSCOPY WITH RETROGRADE PYELOGRAM, DIAGNOSTIC URETEROSCOPY, BALLOON DILATION AND STENT EXCHANGE;  Surgeon: Noel Christmas, MD;  Location: [facility];  Service: Urology;  Laterality: Right;   I & D  EXTREMITY  05/21/2012   Procedure: IRRIGATION AND DEBRIDEMENT EXTREMITY;  Surgeon: Dominica Severin, MD;  Location: [facility] OR;  Service: Orthopedics;  Laterality: Right;  with tendon repair     reports that he has never smoked. He has never used smokeless tobacco. He reports that he does not currently use alcohol. He reports that he does not use drugs.  No Known Allergies  History reviewed. No pertinent family history.  Prior to Admission medications   Medication Sig Start Date End Date Taking? Authorizing Provider  senna-docusate (SENOKOT-S) 8.6-50 MG tablet Take 1 tablet by mouth at bedtime as needed for mild constipation. 06/20/21  Yes Meredeth Ide, MD  sulfamethoxazole-trimethoprim (BACTRIM DS) 800-160 MG tablet Take 1 tablet by mouth every 12 (twelve) hours. Patient not taking: Reported on 08/03/2021 06/20/21   Meredeth Ide, MD  tamsulosin (FLOMAX) 0.4 MG CAPS capsule Take 1 capsule (0.4 mg total) by mouth daily. Patient not taking: Reported on 08/03/2021 06/21/21   Meredeth Ide, MD  tamsulosin (FLOMAX) 0.4 MG CAPS capsule Take 1 capsule (0.4 mg total) by mouth daily. Patient not taking: Reported on 08/03/2021 07/10/21   Noel Christmas, MD  traMADol (ULTRAM) 50 MG tablet Take 1 tablet (50 mg total) by mouth every 6 (six) hours as needed. Patient not taking: Reported on 08/03/2021 07/10/21 07/10/22  Noel Christmas, MD    Physical Exam: Constitutional: Moderately built and nourished. Vitals:   08/04/21 0030 08/04/21 0046 08/04/21 0107 08/04/21 0204  BP: 103/66 116/75 (!) 159/87 106/72  Pulse: (!) 106 (!) 107 (!) 141 (!) 127  Resp: (!) 22 20  18   Temp:  99.5 F (37.5 C) (!) 100.7 F (38.2 C) (!) 102.9 F (39.4 C)  TempSrc:   Oral Oral  SpO2: 100% 99% 97% 95%  Weight:      Height:       Eyes: Anicteric no pallor. ENMT: No discharge from the ears eyes nose and mouth. Neck: No mass felt.  No neck rigidity. Respiratory: No rhonchi or crepitations. Cardiovascular: S1-S2  heard. Abdomen: Soft nontender bowel sounds present. Musculoskeletal: No edema. Skin: No rash. Neurologic: Alert awake oriented to time place and person.  Moves all extremities. Psychiatric: Appears normal.  Normal affect.   Labs on Admission: I have personally reviewed following labs and imaging studies  CBC: Recent Labs  Lab 08/03/21 2000  WBC 13.6*  NEUTROABS 10.7*  HGB 13.0  HCT 39.6  MCV 90.4  PLT 426*   Basic Metabolic Panel: Recent Labs  Lab 08/03/21 2000  NA 133*  K 3.8  CL 99  CO2 21*  GLUCOSE 109*  BUN 11  CREATININE 1.18  CALCIUM 9.0   GFR: Estimated Creatinine Clearance: 70.6 mL/min (by C-G formula based on SCr of 1.18 mg/dL). Liver Function Tests: Recent Labs  Lab 08/03/21 2000  AST 15  ALT 12  ALKPHOS 73  BILITOT 1.0  PROT 7.8  ALBUMIN 3.3*   No results for input(s): LIPASE, AMYLASE in the last 168 hours. No results for input(s): AMMONIA in the last 168 hours. Coagulation Profile: Recent Labs  Lab 08/03/21 2000  INR 1.1   Cardiac Enzymes: No results for input(s): CKTOTAL, CKMB, CKMBINDEX, TROPONINI in the last 168 hours. BNP (last 3 results) No results for input(s): PROBNP in the last 8760 hours. HbA1C: No results for input(s): HGBA1C in the last 72 hours. CBG: No results for input(s): GLUCAP in the last 168 hours. Lipid Profile: No results for input(s): CHOL, HDL, LDLCALC, TRIG, CHOLHDL, LDLDIRECT in the last 72 hours. Thyroid Function Tests: No results for input(s): TSH, T4TOTAL, FREET4, T3FREE, THYROIDAB in the last 72 hours. Anemia Panel: No results for input(s): VITAMINB12, FOLATE, FERRITIN, TIBC, IRON, RETICCTPCT in the last 72 hours. Urine analysis:    Component Value Date/Time   COLORURINE AMBER (A) 08/03/2021 1945   APPEARANCEUR CLOUDY (A) 08/03/2021 1945   LABSPEC 1.018 08/03/2021 1945   PHURINE 6.0 08/03/2021 1945   GLUCOSEU NEGATIVE 08/03/2021 1945   HGBUR MODERATE (A) 08/03/2021 1945   BILIRUBINUR NEGATIVE  08/03/2021 1945   KETONESUR 80 (A) 08/03/2021 1945   PROTEINUR 100 (A) 08/03/2021 1945   NITRITE NEGATIVE 08/03/2021 1945   LEUKOCYTESUR LARGE (A) 08/03/2021 1945   Sepsis Labs: @LABRCNTIP (procalcitonin:4,lacticidven:4) ) Recent Results (from the past 240 hour(s))  Resp Panel by RT-PCR (Flu A&B, Covid) Nasopharyngeal Swab     Status: None   Collection Time: 08/03/21  7:56 PM   Specimen: Nasopharyngeal Swab; Nasopharyngeal(NP) swabs in vial transport medium  Result Value Ref Range Status   SARS Coronavirus 2 by RT PCR NEGATIVE NEGATIVE Final    Comment: (NOTE) SARS-CoV-2 target nucleic acids are NOT DETECTED.  The SARS-CoV-2 RNA is generally detectable in upper respiratory specimens during the acute phase of infection. The lowest concentration of SARS-CoV-2 viral copies this assay can detect is 138 copies/mL. A negative result does not preclude SARS-Cov-2 infection and should not be used as the sole basis for treatment or other patient management decisions. A negative result may occur with  improper specimen collection/handling, submission of specimen other than nasopharyngeal swab, presence of  viral mutation(s) within the areas targeted by this assay, and inadequate number of viral copies(<138 copies/mL). A negative result must be combined with clinical observations, patient history, and epidemiological information. The expected result is Negative.  Fact Sheet for Patients:  BloggerCourse.com  Fact Sheet for Healthcare Providers:  SeriousBroker.it  This test is no t yet approved or cleared by the [place] FDA and  has been authorized for detection and/or diagnosis of SARS-CoV-2 by FDA under an Emergency Use Authorization (EUA). This EUA will remain  in effect (meaning this test can be used) for the duration of the COVID-19 declaration under Section 564(b)(1) of the Act, 21 U.S.C.section 360bbb-3(b)(1), unless the  authorization is terminated  or revoked sooner.       Influenza A by PCR NEGATIVE NEGATIVE Final   Influenza B by PCR NEGATIVE NEGATIVE Final    Comment: (NOTE) The Xpert Xpress SARS-CoV-2/FLU/RSV plus assay is intended as an aid in the diagnosis of influenza from Nasopharyngeal swab specimens and should not be used as a sole basis for treatment. Nasal washings and aspirates are unacceptable for Xpert Xpress SARS-CoV-2/FLU/RSV testing.  Fact Sheet for Patients: BloggerCourse.com  Fact Sheet for Healthcare Providers: SeriousBroker.it  This test is not yet approved or cleared by the [place] FDA and has been authorized for detection and/or diagnosis of SARS-CoV-2 by FDA under an Emergency Use Authorization (EUA). This EUA will remain in effect (meaning this test can be used) for the duration of the COVID-19 declaration under Section 564(b)(1) of the Act, 21 U.S.C. section 360bbb-3(b)(1), unless the authorization is terminated or revoked.  Performed at [facility], [address], [place], [place] [address]      Radiological Exams on Admission: DG Chest Port 1 View  Result Date: 08/03/2021 CLINICAL DATA:  Questionable sepsis. EXAM: PORTABLE CHEST 1 VIEW COMPARISON:  Chest radiograph dated 07/17/2020. FINDINGS: The heart size and mediastinal contours are within normal limits. Both lungs are clear. The visualized skeletal structures are unremarkable. IMPRESSION: No active disease. Electronically Signed   By: Elgie Collard M.D.   On: 08/03/2021 20:13   CT Renal Stone Study  Result Date: 08/03/2021 CLINICAL DATA:  Abdominal pain. EXAM: CT ABDOMEN AND PELVIS WITHOUT CONTRAST TECHNIQUE: Multidetector CT imaging of the abdomen and pelvis was performed following the standard protocol without IV contrast. COMPARISON:  June 18, 2021 FINDINGS: Lower chest: No acute abnormality. Hepatobiliary: No focal liver abnormality is  seen. No gallstones, gallbladder wall thickening, or biliary dilatation. Pancreas: Unremarkable. No pancreatic ductal dilatation or surrounding inflammatory changes. Spleen: Normal in size without focal abnormality. Adrenals/Urinary Tract: Adrenal glands are unremarkable. Kidneys are normal in size, without focal lesions. A right-sided endo ureteral stent is in place. The proximal portion of the stent is seen within the proximal right ureter. The distal aspect of the stent is seen within the lumen of the urinary bladder. Moderate severity right-sided hydronephrosis and hydroureter are seen to the level of the proximal portion of the endo ureteral stent. Bladder is unremarkable. Stomach/Bowel: Stomach is within normal limits. Appendix appears normal. No evidence of bowel wall thickening, distention, or inflammatory changes. Vascular/Lymphatic: No significant vascular findings are present. No enlarged abdominal or pelvic lymph nodes. Reproductive: Prostate is unremarkable. Other: No abdominal wall hernia or abnormality. No abdominopelvic ascites. Musculoskeletal: No acute or significant osseous findings. IMPRESSION: Malpositioned right-sided endo ureteral stent with moderate severity right-sided hydronephrosis and hydroureter. Electronically Signed   By: Aram Candela M.D.   On: 08/03/2021 20:37  EKG: Independently reviewed.  Sinus tachycardia.  Assessment/Plan Principal Problem:   Sepsis (HCC) Active Problems:   Hydronephrosis of right kidney   Displacement of ureteral stent (HCC)    Sepsis in the setting of urinary tract infection with malposition and right ureteral stent with hydronephrosis and hydroureter presently postoperative of the stent was repositioned.  Patient on presentation was tachycardic febrile leukocytosis consistent with sepsis.  Follow cultures continue with hydration and antibiotics. Intractable nausea vomiting likely from sepsis and hydronephrosis.   DVT prophylaxis:  SCDs. Code Status: Full code. Family Communication: Patient and family at the bedside. Disposition Plan: Home when stable. Consults called: Urology. Admission status: Observation.   Eduard Clos MD [facility] Pager [phone].  If 7PM-7AM, please contact night-coverage [email or web address] Password [email or web address]  08/04/2021, 2:45 AM

## 2021-08-04 NOTE — Transfer of Care (Signed)
Immediate Anesthesia Transfer of Care Note  Patient: Jordan Patrick  Procedure(s) Performed: CYSTOSCOPY WITH RETROGRADE PYELOGRAM, DIAGNOSTIC URETEROSCOPY AND STENT EXCHANGE (Right: Ureter)  Patient Location: PACU  Anesthesia Type:General  Level of Consciousness: awake, alert  and oriented  Airway & Oxygen Therapy: Patient Spontanous Breathing and Patient connected to face mask  Post-op Assessment: Report given to RN and Post -op Vital signs reviewed and stable  Post vital signs: Reviewed and stable  Last Vitals:  Vitals Value Taken Time  BP 103/66 08/04/21 0030  Temp 37.2 C 08/04/21 0028  Pulse 109 08/04/21 0033  Resp 19 08/04/21 0033  SpO2 100 % 08/04/21 0033  Vitals shown include unvalidated device data.  Last Pain:  Vitals:   08/03/21 2207  TempSrc:   PainSc: 6          Complications: No notable events documented.

## 2021-08-04 NOTE — Progress Notes (Signed)
1 Day Post-Op Subjective: Pain controlled. No nausea or emesis. Fever curve downtrending.  Objective: Vital signs in last 24 hours: Temp:  [97.2 F (36.2 C)-102.9 F (39.4 C)] 97.2 F (36.2 C) (11/26 0558) Pulse Rate:  [77-141] 77 (11/26 0558) Resp:  [16-24] 16 (11/26 0408) BP: (92-159)/(62-87) 97/68 (11/26 0558) SpO2:  [95 %-100 %] 98 % (11/26 0558) Weight:  [74.2 kg] 74.2 kg (11/25 1917)  Intake/Output from previous day: 11/25 0701 - 11/26 0700 In: 113.4 [I.V.:113.4] Out: 1200 [Urine:1200] Intake/Output this shift: No intake/output data recorded.  Physical Exam:  General: Alert and oriented CV: RRR Lungs: Clear Abdomen: Soft, ND, NT GU: foley draining clear yellow urine Ext: NT, No erythema  Lab Results: Recent Labs    08/03/21 2000 08/04/21 0347  HGB 13.0 11.2*  HCT 39.6 33.1*   BMET Recent Labs    08/03/21 2000 08/04/21 0347  NA 133* 132*  K 3.8 3.6  CL 99 98  CO2 21* 23  GLUCOSE 109* 143*  BUN 11 12  CREATININE 1.18 1.01  CALCIUM 9.0 8.5*     Studies/Results: DG Chest Port 1 View  Result Date: 08/03/2021 CLINICAL DATA:  Questionable sepsis. EXAM: PORTABLE CHEST 1 VIEW COMPARISON:  Chest radiograph dated 07/17/2020. FINDINGS: The heart size and mediastinal contours are within normal limits. Both lungs are clear. The visualized skeletal structures are unremarkable. IMPRESSION: No active disease. Electronically Signed   By: Elgie Collard M.D.   On: 08/03/2021 20:13   DG C-Arm 1-60 Min-No Report  Result Date: 08/04/2021 Fluoroscopy was utilized by the requesting physician.  No radiographic interpretation.   CT Renal Stone Study  Result Date: 08/03/2021 CLINICAL DATA:  Abdominal pain. EXAM: CT ABDOMEN AND PELVIS WITHOUT CONTRAST TECHNIQUE: Multidetector CT imaging of the abdomen and pelvis was performed following the standard protocol without IV contrast. COMPARISON:  June 18, 2021 FINDINGS: Lower chest: No acute abnormality. Hepatobiliary:  No focal liver abnormality is seen. No gallstones, gallbladder wall thickening, or biliary dilatation. Pancreas: Unremarkable. No pancreatic ductal dilatation or surrounding inflammatory changes. Spleen: Normal in size without focal abnormality. Adrenals/Urinary Tract: Adrenal glands are unremarkable. Kidneys are normal in size, without focal lesions. A right-sided endo ureteral stent is in place. The proximal portion of the stent is seen within the proximal right ureter. The distal aspect of the stent is seen within the lumen of the urinary bladder. Moderate severity right-sided hydronephrosis and hydroureter are seen to the level of the proximal portion of the endo ureteral stent. Bladder is unremarkable. Stomach/Bowel: Stomach is within normal limits. Appendix appears normal. No evidence of bowel wall thickening, distention, or inflammatory changes. Vascular/Lymphatic: No significant vascular findings are present. No enlarged abdominal or pelvic lymph nodes. Reproductive: Prostate is unremarkable. Other: No abdominal wall hernia or abnormality. No abdominopelvic ascites. Musculoskeletal: No acute or significant osseous findings. IMPRESSION: Malpositioned right-sided endo ureteral stent with moderate severity right-sided hydronephrosis and hydroureter. Electronically Signed   By: Aram Candela M.D.   On: 08/03/2021 20:37    Assessment/Plan: Right hydronephrosis from proximal right ureteral stricture: S/p R URS/balloon dilation, right ureteral stent by Dr. Arita Miss on 07/10/2021.                                    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 Tali Coster MD 08/04/2021, 8:31 AM [facility]  Pager: [phone]

## 2021-08-05 ENCOUNTER — Encounter (HOSPITAL_COMMUNITY): Payer: Self-pay | Admitting: Internal Medicine

## 2021-08-05 ENCOUNTER — Inpatient Hospital Stay (HOSPITAL_COMMUNITY): Payer: Self-pay

## 2021-08-05 DIAGNOSIS — N390 Urinary tract infection, site not specified: Secondary | ICD-10-CM

## 2021-08-05 DIAGNOSIS — N3000 Acute cystitis without hematuria: Secondary | ICD-10-CM

## 2021-08-05 MED ORDER — BISACODYL 10 MG RE SUPP
10.0000 mg | Freq: Once | RECTAL | Status: AC
  Administered 2021-08-05: 19:00:00 10 mg via RECTAL
  Filled 2021-08-05: qty 1

## 2021-08-05 MED ORDER — DARIFENACIN HYDROBROMIDE ER 15 MG PO TB24
15.0000 mg | ORAL_TABLET | Freq: Every day | ORAL | Status: DC
  Administered 2021-08-05 – 2021-08-07 (×3): 15 mg via ORAL
  Filled 2021-08-05 (×3): qty 1

## 2021-08-05 MED ORDER — IOHEXOL 350 MG/ML SOLN
100.0000 mL | Freq: Once | INTRAVENOUS | Status: AC | PRN
  Administered 2021-08-05: 14:00:00 100 mL via INTRAVENOUS

## 2021-08-05 NOTE — Hospital Course (Signed)
Jordan Patrick is a 50 y.o. male with recent history of right ureteral stent placement secondary to hydronephrosis. Patient presented secondary to nausea/vomiting and abdominal discomfort with evidence of malpositioned right ureteral stent with associated hydronephrosis/hydroureter in addition to UTI. Empiric antibiotics initiated and patient had stent exchange performed by Urology.

## 2021-08-05 NOTE — Progress Notes (Signed)
Initial Nutrition Assessment RD working remotely.  DOCUMENTATION CODES:   Not applicable  INTERVENTION:  - continue Ensure Plus High Protein BID, each supplement provides 350 kcal and 20 grams of protein.   NUTRITION DIAGNOSIS:   Increased nutrient needs related to acute illness, post-op healing as evidenced by estimated needs.  GOAL:   Patient will meet greater than or equal to 90% of their needs  MONITOR:   PO intake, Supplement acceptance, Labs, Weight trends  REASON FOR ASSESSMENT:   Malnutrition Screening Tool  ASSESSMENT:   50 y.o. male with medical history of tuberculosis and stent placement for R-sided hydronephrosis in 06/2021 (stent replaced  3 weeks later). He presented to the ED due to persistent pain radiating from R flank to the groin and N/V. UA was concerning for UTI and CT renal showed malpositioned R ureteral stent with hydronephrosis and hydroureter. He was emergently taken to the OR d/t sepsis and stent was repositioned. Started on abx and IV fluids.  Patient is noted to need a [place] interpreter. RD is working remotely today.   Diet advanced from NPO to Regular yesterday at [place] and he ate 100% of breakfast this AM, per documentation in the nursing flow sheet.   Ensure was ordered BID yesterday and he has accepted all bottles offered to him so far.   He has not been assessed by a [place] RD at any time in the past.   Weight on 11/25 was 163 lb and weight on 06/18/21 was 174 lb. This indicates 11 lb weight loss (6.3% body weight) in the past 1.5 months.   Patient met criteria for sepsis on admission.    Labs reviewed; Na: 132 mmol/l, Ca: 8.5 mg/dl.   Medications reviewed; 100 mg colace BID, 1 tablet senokot BID.  IVF; NS @ 75 ml/hr.     NUTRITION - FOCUSED PHYSICAL EXAM:  RD working remotely.   Diet Order:   Diet Order             Diet regular Room service appropriate? Yes; Fluid consistency: Thin  Diet effective now                    EDUCATION NEEDS:   No education needs have been identified at this time  Skin:  Skin Assessment: Reviewed RN Assessment  Last BM:  PTA/unknown  Height:   Ht Readings from Last 1 Encounters:  08/03/21 _0  (1.626 m)    Weight:   Wt Readings from Last 1 Encounters:  08/03/21 74.2 kg     Estimated Nutritional Needs:  Kcal:  1800-2000 kcal Protein:  90-105 grams Fluid:  >/=1.8 L/day      Jarome Matin, MS, RD, LDN, CNSC Inpatient Clinical Dietitian RD pager # available in AMION  After hours/weekend pager # available in [facility]

## 2021-08-05 NOTE — Progress Notes (Signed)
2 Days Post-Op Subjective: Denies abdominal pain or flank pain. Afebrile. Tolerating condom catheter. I offered removal, however he would like to continue given his urinary urgency.  Objective: Vital signs in last 24 hours: Temp:  [97.6 F (36.4 C)-98.2 F (36.8 C)] 98.2 F (36.8 C) (11/27 1231) Pulse Rate:  [70-88] 88 (11/27 1231) Resp:  [16-20] 16 (11/27 1231) BP: (107-115)/(64-76) 107/68 (11/27 1231) SpO2:  [96 %-98 %] 96 % (11/27 1231)  Intake/Output from previous day: 11/26 0701 - 11/27 0700 In: 2059.9 [I.V.:1959.9; IV Piggyback:100] Out: 1150 [Urine:1150] Intake/Output this shift: Total I/O In: 120 [P.O.:120] Out: -   Physical Exam:  General: Alert and oriented CV: RRR Lungs: Clear Abdomen: Soft, ND, NT Ext: NT, No erythema  Lab Results: Recent Labs    08/03/21 2000 08/04/21 0347  HGB 13.0 11.2*  HCT 39.6 33.1*   BMET Recent Labs    08/03/21 2000 08/04/21 0347  NA 133* 132*  K 3.8 3.6  CL 99 98  CO2 21* 23  GLUCOSE 109* 143*  BUN 11 12  CREATININE 1.18 1.01  CALCIUM 9.0 8.5*     Studies/Results: DG Chest Port 1 View  Result Date: 08/03/2021 CLINICAL DATA:  Questionable sepsis. EXAM: PORTABLE CHEST 1 VIEW COMPARISON:  Chest radiograph dated 07/17/2020. FINDINGS: The heart size and mediastinal contours are within normal limits. Both lungs are clear. The visualized skeletal structures are unremarkable. IMPRESSION: No active disease. Electronically Signed   By: Elgie Collard M.D.   On: 08/03/2021 20:13   DG C-Arm 1-60 Min-No Report  Result Date: 08/04/2021 Fluoroscopy was utilized by the requesting physician.  No radiographic interpretation.   CT Renal Stone Study  Result Date: 08/03/2021 CLINICAL DATA:  Abdominal pain. EXAM: CT ABDOMEN AND PELVIS WITHOUT CONTRAST TECHNIQUE: Multidetector CT imaging of the abdomen and pelvis was performed following the standard protocol without IV contrast. COMPARISON:  June 18, 2021 FINDINGS: Lower chest:  No acute abnormality. Hepatobiliary: No focal liver abnormality is seen. No gallstones, gallbladder wall thickening, or biliary dilatation. Pancreas: Unremarkable. No pancreatic ductal dilatation or surrounding inflammatory changes. Spleen: Normal in size without focal abnormality. Adrenals/Urinary Tract: Adrenal glands are unremarkable. Kidneys are normal in size, without focal lesions. A right-sided endo ureteral stent is in place. The proximal portion of the stent is seen within the proximal right ureter. The distal aspect of the stent is seen within the lumen of the urinary bladder. Moderate severity right-sided hydronephrosis and hydroureter are seen to the level of the proximal portion of the endo ureteral stent. Bladder is unremarkable. Stomach/Bowel: Stomach is within normal limits. Appendix appears normal. No evidence of bowel wall thickening, distention, or inflammatory changes. Vascular/Lymphatic: No significant vascular findings are present. No enlarged abdominal or pelvic lymph nodes. Reproductive: Prostate is unremarkable. Other: No abdominal wall hernia or abnormality. No abdominopelvic ascites. Musculoskeletal: No acute or significant osseous findings. IMPRESSION: Malpositioned right-sided endo ureteral stent with moderate severity right-sided hydronephrosis and hydroureter. Electronically Signed   By: Aram Candela M.D.   On: 08/03/2021 20:37    Assessment/Plan: Right hydronephrosis from proximal right ureteral stricture: S/p R URS/balloon dilation, right ureteral stent by Dr. Arita Miss on 07/10/2021.                                    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 Resolving  -Fever curve downtrended.  Remains afebrile for >24 hours. -Ucx >100K Enterobacter Aerogenes, sensitivities pending -Will obtain CTA abdomen to evaluate for crossing vessel. Ordered. -Pt would like to keep his condom  catheter for now given his urinary urgency.  -Anticipate discharge home tomorrow   LOS: 1 day   Matt R. Jaelyn Bourgoin MD 08/05/2021, 12:47 PM [facility]  Pager: [phone]

## 2021-08-05 NOTE — Assessment & Plan Note (Signed)
Urology. Right ureteral stent exchanged on 11/26.

## 2021-08-05 NOTE — Assessment & Plan Note (Addendum)
In setting of right ureteral stent. Management as mentioned above.

## 2021-08-05 NOTE — Assessment & Plan Note (Signed)
In setting of displaced right ureteral stent. Exchange performed as mentioned above.

## 2021-08-05 NOTE — Progress Notes (Signed)
PROGRESS NOTE    Jordan Patrick  CXK:[id number] DOB: Jan 22, 1971 DOA: 08/03/2021 PCP: Oneita Hurt, No   Brief Narrative: Jordan Patrick is a 50 y.o. male with recent history of right ureteral stent placement secondary to hydronephrosis. Patient presented secondary to nausea/vomiting and abdominal discomfort with evidence of malpositioned right ureteral stent with associated hydronephrosis/hydroureter in addition to UTI. Empiric antibiotics initiated and patient had stent exchange performed by Urology.   Assessment & Plan:   * Sepsis (HCC) Present on admission. Secondary to UTI. Empirically started on Ceftriaxone. Blood cultures with no growth to date. Urine culture with enterobacter aerogenes. Leukocytosis worsened. -Continue Ceftriaxone and follow-up cultures -CBC in AM -Urology recommendation: CT scan today  Displacement of ureteral stent [facility]) Urology. Right ureteral stent exchanged on 11/26.  UTI (urinary tract infection) In setting of right ureteral stent. Patient started empirically on Ceftriaxone as mentioned above. Urine culture significant for enterobacter species. -Continue Ceftriaxone -Follow-up urine culture sensitivities  Hydronephrosis of right kidney In setting of displaced right ureteral stent. Exchange performed as mentioned above.     DVT prophylaxis: Heparin subq Code Status:   Code Status: Full Code Family Communication: Sister at bedside Disposition Plan: Discharge home likely in 1-2 days   Consultants:  Urology  Procedures:  UROLOGY PROCEDURE (08/04/2021) Cystoscopy right ureteral stent exchange (6Fr x 26 cm no tether) right retrograde pyelography with interpretation  Right diagnostic ureteroscopy   Antimicrobials: Ceftriaxone IV    Subjective: No concerns this morning. Feeling better.  Objective: Vitals:   08/04/21 1326 08/04/21 2232 08/05/21 0949 08/05/21 1231  BP: 109/64 107/72 115/76 107/68  Pulse: 75 70 83 88  Resp: 16 20  18 16   Temp: 97.6 F (36.4 C) 98 F (36.7 C)  98.2 F (36.8 C)  TempSrc: Oral   Oral  SpO2: 97% 98% 98% 96%  Weight:      Height:        Intake/Output Summary (Last 24 hours) at 08/05/2021 1443 Last data filed at 08/05/2021 1427 Gross per 24 hour  Intake 2805.72 ml  Output 1600 ml  Net 1205.72 ml    Filed Weights   08/03/21 1917  Weight: 74.2 kg    Examination:  General exam: Appears calm and comfortable Respiratory system: Clear to auscultation. Respiratory effort normal. Cardiovascular system: S1 & S2 heard, RRR. No murmurs, rubs, gallops or clicks. Gastrointestinal system: Abdomen is nondistended, soft and nontender. No organomegaly or masses felt. Normal bowel sounds heard. Central nervous system: Alert and oriented. No focal neurological deficits. Musculoskeletal: No edema. No calf tenderness Skin: No cyanosis. No rashes Psychiatry: Judgement and insight appear normal. Mood & affect appropriate.     Data Reviewed: I have personally reviewed following labs and imaging studies  CBC Lab Results  Component Value Date   WBC 18.2 (H) 08/04/2021   RBC 3.70 (L) 08/04/2021   HGB 11.2 (L) 08/04/2021   HCT 33.1 (L) 08/04/2021   MCV 89.5 08/04/2021   MCH 30.3 08/04/2021   PLT 338 08/04/2021   MCHC 33.8 08/04/2021   RDW 12.6 08/04/2021   LYMPHSABS 1.5 08/03/2021   MONOABS 1.3 (H) 08/03/2021   EOSABS 0.0 08/03/2021   BASOSABS 0.0 08/03/2021     Last metabolic panel Lab Results  Component Value Date   NA 132 (L) 08/04/2021   K 3.6 08/04/2021   CL 98 08/04/2021   CO2 23 08/04/2021   BUN 12 08/04/2021   CREATININE 1.01 08/04/2021   GLUCOSE 143 (H) 08/04/2021   GFRNONAA >  60 08/04/2021   GFRAA >90 05/21/2012   CALCIUM 8.5 (L) 08/04/2021   PROT 7.8 08/03/2021   ALBUMIN 3.3 (L) 08/03/2021   BILITOT 1.0 08/03/2021   ALKPHOS 73 08/03/2021   AST 15 08/03/2021   ALT 12 08/03/2021   ANIONGAP 11 08/04/2021    CBG (last 3)  No results for input(s): GLUCAP in  the last 72 hours.   GFR: Estimated Creatinine Clearance: 82.5 mL/min (by C-G formula based on SCr of 1.01 mg/dL).  Coagulation Profile: Recent Labs  Lab 08/03/21 2000  INR 1.1     Recent Results (from the past 240 hour(s))  Urine Culture     Status: Abnormal (Preliminary result)   Collection Time: 08/03/21  7:45 PM   Specimen: In/Out Cath Urine  Result Value Ref Range Status   Specimen Description IN/OUT CATH URINE  Final   Special Requests NONE  Final   Culture (A)  Final    >=100,000 COLONIES/mL ENTEROBACTER AEROGENES SUSCEPTIBILITIES TO FOLLOW Performed at [facility], [address], [place], [place] [address]    Report Status PENDING  Incomplete  Resp Panel by RT-PCR (Flu A&B, Covid) Nasopharyngeal Swab     Status: None   Collection Time: 08/03/21  7:56 PM   Specimen: Nasopharyngeal Swab; Nasopharyngeal(NP) swabs in vial transport medium  Result Value Ref Range Status   SARS Coronavirus 2 by RT PCR NEGATIVE NEGATIVE Final    Comment: (NOTE) SARS-CoV-2 target nucleic acids are NOT DETECTED.  The SARS-CoV-2 RNA is generally detectable in upper respiratory specimens during the acute phase of infection. The lowest concentration of SARS-CoV-2 viral copies this assay can detect is 138 copies/mL. A negative result does not preclude SARS-Cov-2 infection and should not be used as the sole basis for treatment or other patient management decisions. A negative result may occur with  improper specimen collection/handling, submission of specimen other than nasopharyngeal swab, presence of viral mutation(s) within the areas targeted by this assay, and inadequate number of viral copies(<138 copies/mL). A negative result must be combined with clinical observations, patient history, and epidemiological information. The expected result is Negative.  Fact Sheet for Patients:  BloggerCourse.com  Fact Sheet for Healthcare Providers:   SeriousBroker.it  This test is no t yet approved or cleared by the [place] FDA and  has been authorized for detection and/or diagnosis of SARS-CoV-2 by FDA under an Emergency Use Authorization (EUA). This EUA will remain  in effect (meaning this test can be used) for the duration of the COVID-19 declaration under Section 564(b)(1) of the Act, 21 U.S.C.section 360bbb-3(b)(1), unless the authorization is terminated  or revoked sooner.       Influenza A by PCR NEGATIVE NEGATIVE Final   Influenza B by PCR NEGATIVE NEGATIVE Final    Comment: (NOTE) The Xpert Xpress SARS-CoV-2/FLU/RSV plus assay is intended as an aid in the diagnosis of influenza from Nasopharyngeal swab specimens and should not be used as a sole basis for treatment. Nasal washings and aspirates are unacceptable for Xpert Xpress SARS-CoV-2/FLU/RSV testing.  Fact Sheet for Patients: BloggerCourse.com  Fact Sheet for Healthcare Providers: SeriousBroker.it  This test is not yet approved or cleared by the [place] FDA and has been authorized for detection and/or diagnosis of SARS-CoV-2 by FDA under an Emergency Use Authorization (EUA). This EUA will remain in effect (meaning this test can be used) for the duration of the COVID-19 declaration under Section 564(b)(1) of the Act, 21 U.S.C. section 360bbb-3(b)(1), unless the authorization is terminated  or revoked.  Performed at [facility], [address], [place], [place] [address]   Blood Culture (routine x 2)     Status: None (Preliminary result)   Collection Time: 08/03/21  8:00 PM   Specimen: BLOOD  Result Value Ref Range Status   Specimen Description BLOOD SITE NOT SPECIFIED  Final   Special Requests   Final    BOTTLES DRAWN AEROBIC AND ANAEROBIC Blood Culture adequate volume   Culture   Final    NO GROWTH 2 DAYS Performed at [facility], [address]  [place], [place] [address]    Report Status PENDING  Incomplete  Blood Culture (routine x 2)     Status: None (Preliminary result)   Collection Time: 08/03/21  8:00 PM   Specimen: BLOOD  Result Value Ref Range Status   Specimen Description BLOOD SITE NOT SPECIFIED  Final   Special Requests   Final    BOTTLES DRAWN AEROBIC ONLY Blood Culture adequate volume   Culture   Final    NO GROWTH 2 DAYS Performed at [facility], [address], [place], [place] [address]    Report Status PENDING  Incomplete         Radiology Studies: DG Chest Port 1 View  Result Date: 08/03/2021 CLINICAL DATA:  Questionable sepsis. EXAM: PORTABLE CHEST 1 VIEW COMPARISON:  Chest radiograph dated 07/17/2020. FINDINGS: The heart size and mediastinal contours are within normal limits. Both lungs are clear. The visualized skeletal structures are unremarkable. IMPRESSION: No active disease. Electronically Signed   By: Elgie Collard M.D.   On: 08/03/2021 20:13   DG C-Arm 1-60 Min-No Report  Result Date: 08/04/2021 Fluoroscopy was utilized by the requesting physician.  No radiographic interpretation.   CT Renal Stone Study  Result Date: 08/03/2021 CLINICAL DATA:  Abdominal pain. EXAM: CT ABDOMEN AND PELVIS WITHOUT CONTRAST TECHNIQUE: Multidetector CT imaging of the abdomen and pelvis was performed following the standard protocol without IV contrast. COMPARISON:  June 18, 2021 FINDINGS: Lower chest: No acute abnormality. Hepatobiliary: No focal liver abnormality is seen. No gallstones, gallbladder wall thickening, or biliary dilatation. Pancreas: Unremarkable. No pancreatic ductal dilatation or surrounding inflammatory changes. Spleen: Normal in size without focal abnormality. Adrenals/Urinary Tract: Adrenal glands are unremarkable. Kidneys are normal in size, without focal lesions. A right-sided endo ureteral stent is in place. The proximal portion of the stent is seen within the proximal right ureter. The  distal aspect of the stent is seen within the lumen of the urinary bladder. Moderate severity right-sided hydronephrosis and hydroureter are seen to the level of the proximal portion of the endo ureteral stent. Bladder is unremarkable. Stomach/Bowel: Stomach is within normal limits. Appendix appears normal. No evidence of bowel wall thickening, distention, or inflammatory changes. Vascular/Lymphatic: No significant vascular findings are present. No enlarged abdominal or pelvic lymph nodes. Reproductive: Prostate is unremarkable. Other: No abdominal wall hernia or abnormality. No abdominopelvic ascites. Musculoskeletal: No acute or significant osseous findings. IMPRESSION: Malpositioned right-sided endo ureteral stent with moderate severity right-sided hydronephrosis and hydroureter. Electronically Signed   By: Aram Candela M.D.   On: 08/03/2021 20:37        Scheduled Meds:  darifenacin  15 mg Oral Daily   docusate sodium  100 mg Oral BID   feeding supplement  237 mL Oral BID BM   heparin  5,000 Units Subcutaneous Q8H   senna  1 tablet Oral BID   tamsulosin  0.4 mg Oral Daily  Continuous Infusions:  sodium chloride 75 mL/hr at 08/05/21 1427   cefTRIAXone (ROCEPHIN)  IV 2 g (08/04/21 2245)     LOS: 1 day     Jacquelin Hawking, MD [facility] 08/05/2021, 2:43 PM  If 7PM-7AM, please contact night-coverage [email or web address]

## 2021-08-05 NOTE — Progress Notes (Signed)
Patient sat in the chair for 25 minutes. Pt complained of abdominal soreness 7/10. Pt returned to bed.

## 2021-08-05 NOTE — Progress Notes (Signed)
The patient is out of the bed to chair. He requests to return after 30 minutes. Education provided regarding the need for increased mobility. The pt has been encouraged to stay in chair for as long as able to tolerate. Short term goal is an hour. The pt was also encouraged to use incentive spirometer. Will continue to encourage and monitor.

## 2021-08-06 DIAGNOSIS — K5900 Constipation, unspecified: Secondary | ICD-10-CM

## 2021-08-06 DIAGNOSIS — T7840XA Allergy, unspecified, initial encounter: Secondary | ICD-10-CM

## 2021-08-06 LAB — BASIC METABOLIC PANEL
Anion gap: 9 (ref 5–15)
BUN: 10 mg/dL (ref 6–20)
CO2: 25 mmol/L (ref 22–32)
Calcium: 8.6 mg/dL — ABNORMAL LOW (ref 8.9–10.3)
Chloride: 104 mmol/L (ref 98–111)
Creatinine, Ser: 0.62 mg/dL (ref 0.61–1.24)
GFR, Estimated: >60 mL/min (ref >60)
Glucose, Bld: 97 mg/dL (ref 70–99)
Potassium: 3.9 mmol/L (ref 3.5–5.1)
Sodium: 138 mmol/L (ref 135–145)

## 2021-08-06 LAB — URINE CULTURE: Culture: >=100000 — AB

## 2021-08-06 LAB — CBC
HCT: 34.6 % — ABNORMAL LOW (ref 39.0–52.0)
Hemoglobin: 11.5 g/dL — ABNORMAL LOW (ref 13.0–17.0)
MCH: 29.5 pg (ref 26.0–34.0)
MCHC: 33.2 g/dL (ref 30.0–36.0)
MCV: 88.7 fL (ref 80.0–100.0)
Platelets: 325 10*3/uL (ref 150–400)
RBC: 3.9 MIL/uL — ABNORMAL LOW (ref 4.22–5.81)
RDW: 13 % (ref 11.5–15.5)
WBC: 10.6 10*3/uL — ABNORMAL HIGH (ref 4.0–10.5)
nRBC: 0 % (ref 0.0–0.2)

## 2021-08-06 MED ORDER — TRAMADOL HCL 50 MG PO TABS: 50.0000 mg | ORAL_TABLET | Freq: Four times a day (QID) | ORAL | 0 refills | Status: AC | PRN

## 2021-08-06 MED ORDER — SULFAMETHOXAZOLE-TRIMETHOPRIM 800-160 MG PO TABS
1.0000 | ORAL_TABLET | Freq: Two times a day (BID) | ORAL | Status: DC
  Administered 2021-08-06 – 2021-08-07 (×2): 1 via ORAL
  Filled 2021-08-06 (×2): qty 1

## 2021-08-06 MED ORDER — ENSURE ENLIVE PO LIQD: 237.0000 mL | Freq: Two times a day (BID) | ORAL | 60 refills | Status: AC

## 2021-08-06 MED ORDER — POLYETHYLENE GLYCOL 3350 17 G PO PACK
17.0000 g | PACK | Freq: Every day | ORAL | 0 refills | Status: DC
Start: 2021-08-06 — End: 2023-01-25

## 2021-08-06 MED ORDER — DIPHENHYDRAMINE HCL 50 MG/ML IJ SOLN
50.0000 mg | Freq: Once | INTRAMUSCULAR | Status: DC
  Administered 2021-08-06: 15:00:00 50 mg via INTRAVENOUS

## 2021-08-06 MED ORDER — SORBITOL 70 % SOLN
960.0000 mL | Freq: Once | ORAL | Status: AC
  Administered 2021-08-06: 960 mL via RECTAL
  Filled 2021-08-06: qty 473

## 2021-08-06 MED ORDER — CIPROFLOXACIN HCL 500 MG PO TABS: 500.0000 mg | ORAL_TABLET | Freq: Two times a day (BID) | ORAL | 0 refills | Status: DC

## 2021-08-06 MED ORDER — POLYETHYLENE GLYCOL 3350 17 G PO PACK
17.0000 g | PACK | Freq: Every day | ORAL | Status: DC
  Administered 2021-08-07: 17 g via ORAL
  Filled 2021-08-06: qty 1

## 2021-08-06 MED ORDER — TAMSULOSIN HCL 0.4 MG PO CAPS
0.4000 mg | ORAL_CAPSULE | Freq: Every day | ORAL | 0 refills | Status: AC
Start: 2021-08-06 — End: ?

## 2021-08-06 MED ORDER — METHYLPREDNISOLONE SODIUM SUCC 40 MG IJ SOLR
40.0000 mg | Freq: Once | INTRAMUSCULAR | Status: AC
  Administered 2021-08-06: 15:00:00 40 mg via INTRAVENOUS
  Filled 2021-08-06: qty 1

## 2021-08-06 MED ORDER — CIPROFLOXACIN HCL 500 MG PO TABS
500.0000 mg | ORAL_TABLET | Freq: Two times a day (BID) | ORAL | Status: DC
  Administered 2021-08-06: 14:00:00 500 mg via ORAL
  Filled 2021-08-06: qty 1

## 2021-08-06 NOTE — Discharge Instructions (Addendum)
Jordan Patrick,  You were in the hospital with a urinary tract infection. Your stent was replaced and you will discharge with antibiotics. Please follow-up with your urologist.  Post stent placement instructions   Definitions:  Ureter: The duct that transports urine from the kidney to the bladder. Stent: A plastic hollow tube that is placed into the ureter, from the kidney to the bladder to prevent the ureter from swelling shut.  General instructions:  Despite the fact that no skin incisions were used, the area around the ureter and bladder is raw and irritated. The stent is a foreign body which can further irritate the bladder wall. This irritation is manifested by increased frequency of urination, both day and night, and by an increase in the urge to urinate. In some, the urge to urinate is present almost always. Sometimes the urge is strong enough that you may not be able to stop your self from urinating. This can often be controlled with medication but does not occur in everyone. A stent can safely be left in place for 3 months or greater.  You may see some blood in your urine while the stent is in place and a few days afterward. Do not be alarmed, even if the urine is clear for a while. Get off your feet and drink lots of fluids until clearing occurs. If you start to pass clots or don't improve, call us.  Diet:  You may return to your normal diet immediately. Because of the raw surface of your bladder, alcohol, spicy foods, foods high in acid and drinks with caffeine may cause irritation or frequency and should be used in moderation. To keep your urine flowing freely and avoid constipation, drink plenty of fluids during the day (8-10 glasses). Tip: Avoid cranberry juice because it is very acidic.  Activity:  Your physical activity doesn't need to be restricted. However, if you are very active, you may see some blood in the urine. We suggest that you reduce your activity under the  circumstances until the bleeding has stopped.  Bowels:  It is important to keep your bowels regular during the postoperative period. Straining with bowel movements can cause bleeding. A bowel movement every other day is reasonable. Use a mild laxative if needed, such as milk of magnesia 2-3 tablespoons, or 2 Dulcolax tablets. Call if you continue to have problems. If you had been taking narcotics for pain, before, during or after your surgery, you may be constipated. Take a laxative if necessary.  Medication:  You should resume your pre-surgery medications unless told not to. In addition you may be given an antibiotic to prevent or treat infection. Antibiotics are not always necessary. All medication should be taken as prescribed until the bottles are finished unless you are having an unusual reaction to one of the drugs.  Problems you should report to [place]:  a. Fever greater than 101F. b. Heavy bleeding, or clots (see notes above about blood in urine). c. Inability to urinate. d. Drug reactions (hives, rash, nausea, vomiting, diarrhea). e. Severe burning or pain with urination that is not improving.

## 2021-08-06 NOTE — TOC Initial Note (Signed)
Transition of Care [facility]) - Initial/Assessment Note    Patient Details  Name: Jordan Patrick MRN: [id number] Date of Birth: 1971/08/28  [facility]) CM/SW Contact:    Golda Acre, RN Phone Number: 08/06/2021, 10:07 AM  Clinical Narrative:                 50 y.o. male with recent history of right ureteral stent placement secondary to hydronephrosis. Patient presented secondary to nausea/vomiting and abdominal discomfort with evidence of malpositioned right ureteral stent with associated hydronephrosis/hydroureter in addition to UTI. Empiric antibiotics initiated and patient had stent exchange performed by Urology.     Assessment & Plan:   * Sepsis (HCC) Present on admission. Secondary to UTI. Empirically started on Ceftriaxone. Blood cultures with no growth to date. Urine culture with enterobacter aerogenes. Leukocytosis worsened. -Continue Ceftriaxone and follow-up cultures -CBC in AM -Urology recommendation: CT scan today   Displacement of ureteral stent [facility]) Urology. Right ureteral stent exchanged on 11/26.   UTI (urinary tract infection) In setting of right ureteral stent. Patient started empirically on Ceftriaxone as mentioned above. Urine culture significant for enterobacter species. -Continue Ceftriaxone -Follow-up urine culture sensitivities   Hydronephrosis of right kidney In setting of displaced right ureteral stent. Exchange performed as mentioned above.    TOC PLAN OF CARE: needs interrupter speaks spanish. Following for toc needs and progress  Expected Discharge Plan: Home/Self Care Barriers to Discharge: Continued Medical Work up   Patient Goals and CMS Choice Patient states their goals for this hospitalization and ongoing recovery are:: not asked speaks spanish      Expected Discharge Plan and Services Expected Discharge Plan: Home/Self Care       Living arrangements for the past 2 months: Single Family Home                                       Prior Living Arrangements/Services Living arrangements for the past 2 months: Single Family Home Lives with:: Self Patient language and need for interpreter reviewed:: Yes (does not speak english speaks spanish)                 Activities of Daily Living Home Assistive Devices/Equipment: None ADL Screening (condition at time of admission) Patient's cognitive ability adequate to safely complete daily activities?: Yes Is the patient deaf or have difficulty hearing?: No Does the patient have difficulty seeing, even when wearing glasses/contacts?: No Does the patient have difficulty concentrating, remembering, or making decisions?: No Patient able to express need for assistance with ADLs?: No Does the patient have difficulty dressing or bathing?: No Independently performs ADLs?: Yes (appropriate for developmental age) Does the patient have difficulty walking or climbing stairs?: No Weakness of Legs: Both Weakness of Arms/Hands: None (knees)  Permission Sought/Granted                  Emotional Assessment Appearance:: Appears stated age     Orientation: : Oriented to Self, Oriented to Place, Oriented to  Time, Oriented to Situation Alcohol / Substance Use: Not Applicable Psych Involvement: No (comment)  Admission diagnosis:  Hydronephrosis of right kidney [N13.30] Displacement of ureteral stent, initial encounter (HCC) [T83.122A] Sepsis, due to unspecified organism, unspecified whether acute organ dysfunction present (HCC) [A41.9] Sepsis (HCC) [A41.9] Patient Active Problem List   Diagnosis Date Noted   UTI (urinary tract infection) 08/05/2021   Displacement of ureteral stent (  HCC)    Hydronephrosis of right kidney 08/03/2021   Sepsis (HCC) 06/18/2021   Kidney stone    PCP:  Pcp, No Pharmacy:   [id number] [address] [address], [place] - [phone] AT [facility] [address] [place]  [address] Phone: [phone] Fax: [phone]     Social Determinants of Health (SDOH) Interventions    Readmission Risk Interventions No flowsheet data found.

## 2021-08-06 NOTE — Assessment & Plan Note (Signed)
Severe itching with forehead rash. Itching improved with Benadryl 25 mg IV. Solu-medrol ordered as well -Continue Benadryl IV prn -Ciprofloxacin added to allergy list

## 2021-08-06 NOTE — Discharge Summary (Deleted)
Physician Discharge Summary  Habram Caler [id number] DOB: May 25, 1971 DOA: 08/03/2021  PCP: Pcp, No  Admit date: 08/03/2021 Discharge date: 08/06/2021  Admitted From: Home Disposition: Home  Recommendations for Outpatient Follow-up:  Follow up with Urology Please obtain BMP/CBC in one week Please follow up on the following pending results: None  Home Health: None Equipment/Devices: None  Discharge Condition: Stable CODE STATUS: Full code Diet recommendation: Regular diet   Brief/Interim Summary:  Admission HPI written by Rise Patience, MD   HPI: Tacuma Lauerman is a 50 y.o. male with history of stent placement for right-sided hydronephrosis in October 1 week subsequent which stent was exchanged 3 weeks ago presents to the ER because of persistent pain radiating from the right flank to the groin and nausea vomiting.  Unable to keep anything.  Patient also has been having fever chills.   Hospital course:  * Sepsis [facility]) Present on admission. Secondary to UTI. Empirically started on Ceftriaxone. Blood cultures with no growth to date. Urine culture with enterobacter aerogenes. Leukocytosis improved with antibiotics. Urine culture significant for enterobacter aerogenes. Patient transitioned to Ciprofloxacin to complete a 7 day course of antibiotics.  UTI (urinary tract infection) In setting of right ureteral stent. Management as mentioned above.  Displacement of ureteral stent [facility]) Urology. Right ureteral stent exchanged on 11/26.  Constipation Dulcolax suppository and enema given.  Hydronephrosis of right kidney In setting of displaced right ureteral stent. Exchange performed as mentioned above.   Discharge Diagnoses:  Principal Problem:   Sepsis [facility]) Active Problems:   Displacement of ureteral stent ([place])   Hydronephrosis of right kidney   UTI (urinary tract infection)    Discharge Instructions   Allergies as of 08/06/2021   No  Known Allergies      Medication List     STOP taking these medications    sulfamethoxazole-trimethoprim 800-160 MG tablet Commonly known as: BACTRIM DS       TAKE these medications    ciprofloxacin 500 MG tablet Commonly known as: CIPRO Take 1 tablet (500 mg total) by mouth 2 (two) times daily for 7 days.   feeding supplement Liqd Take 237 mLs by mouth 2 (two) times daily between meals.   polyethylene glycol 17 g packet Commonly known as: MIRALAX / GLYCOLAX Take 17 g by mouth daily.   senna-docusate 8.6-50 MG tablet Commonly known as: Senokot-S Take 1 tablet by mouth at bedtime as needed for mild constipation.   tamsulosin 0.4 MG Caps capsule Commonly known as: FLOMAX Take 1 capsule (0.4 mg total) by mouth daily. What changed: Another medication with the same name was removed. Continue taking this medication, and follow the directions you see here.   traMADol 50 MG tablet Commonly known as: Ultram Take 1 tablet (50 mg total) by mouth every 6 (six) hours as needed.        Follow-up Information     Robley Fries, MD. Schedule an appointment as soon as possible for a visit.   Specialty: Urology Why: For hospital follow-up Contact information: [address] [place] [address] [phone]                No Known Allergies  Consultations: Urology   Procedures/Studies: [facility] Chest Port 1 View  Result Date: 08/03/2021 CLINICAL DATA:  Questionable sepsis. EXAM: PORTABLE CHEST 1 VIEW COMPARISON:  Chest radiograph dated 07/17/2020. FINDINGS: The heart size and mediastinal contours are within normal limits. Both lungs are clear. The visualized  skeletal structures are unremarkable. IMPRESSION: No active disease. Electronically Signed   By: Anner Crete M.D.   On: 08/03/2021 20:13   DG C-Arm 1-60 Min-No Report  Result Date: 08/04/2021 Fluoroscopy was utilized by the requesting physician.  No radiographic interpretation.   CT Renal  Stone Study  Result Date: 08/03/2021 CLINICAL DATA:  Abdominal pain. EXAM: CT ABDOMEN AND PELVIS WITHOUT CONTRAST TECHNIQUE: Multidetector CT imaging of the abdomen and pelvis was performed following the standard protocol without IV contrast. COMPARISON:  June 18, 2021 FINDINGS: Lower chest: No acute abnormality. Hepatobiliary: No focal liver abnormality is seen. No gallstones, gallbladder wall thickening, or biliary dilatation. Pancreas: Unremarkable. No pancreatic ductal dilatation or surrounding inflammatory changes. Spleen: Normal in size without focal abnormality. Adrenals/Urinary Tract: Adrenal glands are unremarkable. Kidneys are normal in size, without focal lesions. A right-sided endo ureteral stent is in place. The proximal portion of the stent is seen within the proximal right ureter. The distal aspect of the stent is seen within the lumen of the urinary bladder. Moderate severity right-sided hydronephrosis and hydroureter are seen to the level of the proximal portion of the endo ureteral stent. Bladder is unremarkable. Stomach/Bowel: Stomach is within normal limits. Appendix appears normal. No evidence of bowel wall thickening, distention, or inflammatory changes. Vascular/Lymphatic: No significant vascular findings are present. No enlarged abdominal or pelvic lymph nodes. Reproductive: Prostate is unremarkable. Other: No abdominal wall hernia or abnormality. No abdominopelvic ascites. Musculoskeletal: No acute or significant osseous findings. IMPRESSION: Malpositioned right-sided endo ureteral stent with moderate severity right-sided hydronephrosis and hydroureter. Electronically Signed   By: Virgina Norfolk M.D.   On: 08/03/2021 20:37   CT Angio Abd/Pel w/ and/or w/o  Result Date: 08/05/2021 CLINICAL DATA:  Right UPJ obstruction. Evaluate for crossing vessel to explain the etiology of the patient's obstruction. EXAM: CTA ABDOMEN AND PELVIS WITHOUT AND WITH CONTRAST TECHNIQUE: Multidetector  CT imaging of the abdomen and pelvis was performed using the standard protocol during bolus administration of intravenous contrast. Multiplanar reconstructed images and MIPs were obtained and reviewed to evaluate the vascular anatomy. CONTRAST:  168mL OMNIPAQUE IOHEXOL 350 MG/ML SOLN COMPARISON:  CT abdomen pelvis-08/03/2021; 06/18/2021 Intraoperative images during right-sided double-J ureteral stent exchange-08/04/2021 FINDINGS: VASCULAR Aorta: There is no significant atherosclerotic plaque within the normal caliber abdominal aorta which is widely patent without hemodynamically significant narrowing. No evidence of dissection, vessel irregularity or perivascular stranding. Celiac: Widely patent without a hemodynamically significant narrowing. SMA: Widely patent without hemodynamically significant narrowing. A replaced right hepatic artery is noted to arise from the proximal SMA. The distal tributaries of the SMA appear widely patent without discrete intraluminal filling defect to suggest distal embolism. Renals: Solitary duplicated bilaterally; there are small accessory renal arteries seen bilaterally both of which supply the superior poles of the right and left kidneys. All renal arteries are widely patent without a hemodynamically significant narrowing. No vessel irregularity to suggest FMD. No evidence of aneurysm or AV malformation. Note, the location of the suspected UPJ stenosis on initial noncontrast CT scan of the abdomen pelvis performed 06/18/2021 is inferior to the right renal arteries as well as the right renal vein. IMA: Widely patent without a hemodynamically significant narrowing. There is early collateral supply from the SMA. Inflow: There is a very minimal amount of calcified atherosclerotic plaque involving the distal aspect of the right common iliac artery, not resulting in hemodynamically significant stenosis. The bilateral internal and external iliac arteries are of normal caliber and widely  patent without hemodynamically significant  narrowing. Proximal Outflow: The bilateral common femoral and imaged portions of the bilateral deep and superficial femoral arteries are of normal caliber and widely patent without a hemodynamically significant narrowing. Veins: The IVC and pelvic venous systems appear widely patent. Note, the bilateral renal veins are widely patent and of conventional configuration. Review of the MIP images confirms the above findings. _________________________________________________________ NON-VASCULAR Lower chest: Limited visualization of the lower thorax demonstrates minimal dependent subpleural ground-glass atelectasis. No discrete focal airspace opacities. Normal heart size.  No pericardial effusion. Hepatobiliary: Normal hepatic contour. There is a punctate subcentimeter hypoattenuating lesion within the dome of the left lobe of the liver (image 20, series 4), which is too small to accurately characterize though favored to represent a hepatic cyst. No discrete worrisome hyperenhancing hepatic lesions. Normal appearance of the gallbladder given degree distention. No radiopaque gallstones. No intra extrahepatic bili duct dilatation. No ascites. Pancreas: Normal appearance of the pancreas. Spleen: Normal appearance of the spleen. Note is made of a tiny splenule about the anterior inferior aspect of the spleen. Adrenals/Urinary Tract: There is mild slightly delayed enhancement of the right kidney in comparison to the left with associated mild striated nephrogram appearance (representative axial images 31, 37 and 44, series 11; coronal images 53, 62 and 67, series 14). No definite evidence of nephrolithiasis on this postcontrast examination. No discrete worrisome lesions. Interval exchange/repositioning of right-sided double-J ureteral stent with superior coil now better positioned within the superior aspect of the right renal collecting system and inferior coil within the urinary  bladder. This finding is associated with near complete resolution of previously noted mild residual right-sided pelvicaliectasis. Note is made of a tiny focus of intraluminal air about the right renal pelvis at location of previously suboptimally positioned double-J stent (image 64, series 4), the sequela of recent stent exchange and repositioning as demonstrated on intraoperative radiographs performed 08/04/2021. No evidence of left-sided urinary obstruction. There is a tiny focus of air within the urinary bladder, likely the sequela of recent right-sided double-J ureteral stent exchange. Otherwise, normal appearance of the urinary bladder. Stomach/Bowel: Large colonic stool burden, particularly within the sigmoid colon and rectal vault, without evidence of enteric obstruction. Normal appearance of the terminal ileum and the retrocecal appendix. No evidence of hiatal hernia. No discrete areas of bowel wall thickening. No pneumoperitoneum, pneumatosis or portal venous gas. Lymphatic: Scattered retroperitoneal lymph nodes are numerous though individually not enlarged by size criteria with index aortocaval lymph node measuring 0.6 cm in greatest oblique short axis diameter (image 47, series 11) and index retrocaval lymph node measuring 0.7 cm in greatest short axis diameter (image 36, series 11), similar to the 06/2021 examination and presumably reactive in etiology. No bulky retroperitoneal, mesenteric, pelvic or inguinal lymphadenopathy Reproductive: Normal appearance the prostate gland. No free fluid the pelvic cul-de-sac. Other: Regional soft tissues appear normal. Musculoskeletal: No acute or aggressive osseous abnormalities. 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 Stigmata of dish within the lower thoracic spine. Mild degenerative change of the bilateral hips with joint space loss, subchondral sclerosis and osteophytosis. IMPRESSION: 1.  Slightly delayed enhancement of the right kidney with associated striated nephrogram appearance as could be seen in the setting of pyelonephritis. No definable/drainable renal abscess. 2. Interval repositioning/exchange of right-sided double-J ureteral stent with superior coil now better positioned within the superior aspect of the right renal collecting system. No evidence of residual right-sided pelvicaliectasis or ureterectasis. 3. 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 Specifically, the right renal arteries and the right renal vein are located superior to the transition of the UPJ stenosis seen on initial noncontrast CT scan performed 06/18/2021. 4. Large colonic stool burden, particularly within the distal sigmoid colon and rectal vault, without evidence of enteric obstruction. 5. Incidentally noted bilateral L5 pars defects with associated grade 1 anterolisthesis of L5 upon S1. Electronically Signed   By: Sandi Mariscal M.D.   On: 08/05/2021 14:46     Subjective: Patient still has some right sided abdominal pain.  Discharge Exam: Vitals:   08/05/21 2135 08/06/21 1203  BP: 112/74 100/68  Pulse: 82 89  Resp: 20 18  Temp: 98.5 F (36.9 C) 98.1 F (36.7 C)  SpO2: 98% 97%   Vitals:   08/05/21 0949 08/05/21 1231 08/05/21 2135 08/06/21 1203  BP: 115/76 107/68 112/74 100/68  Pulse: 83 88 82 89  Resp: 18 16 20 18   Temp:  98.2 F (36.8 C) 98.5 F (36.9 C) 98.1 F (36.7 C)  TempSrc:  Oral Oral   SpO2: 98% 96% 98% 97%  Weight:      Height:        General: Pt is alert, awake, not in acute distress Cardiovascular: RRR, S1/S2 +, no rubs, no gallops Respiratory: CTA bilaterally, no wheezing, no rhonchi Abdominal: Soft, tenderness in RLQ/LLQ, ND, normal bowel sounds Extremities: no edema, no cyanosis    The results of significant diagnostics from this hospitalization (including imaging, microbiology, ancillary and laboratory)  are listed below for reference.     Microbiology: Recent Results (from the past 240 hour(s))  Urine Culture     Status: Abnormal   Collection Time: 08/03/21  7:45 PM   Specimen: In/Out Cath Urine  Result Value Ref Range Status   Specimen Description IN/OUT CATH URINE  Final   Special Requests   Final    NONE Performed at [facility], [address] [place], [place] [address]    Culture >=100,000 COLONIES/mL ENTEROBACTER AEROGENES (A)  Final   Report Status 08/06/2021 FINAL  Final   Organism ID, Bacteria ENTEROBACTER AEROGENES (A)  Final      Susceptibility   Enterobacter aerogenes - MIC*    CEFAZOLIN >=64 RESISTANT Resistant     CEFEPIME <=0.12 SENSITIVE Sensitive     CEFTRIAXONE 8 RESISTANT Resistant     CIPROFLOXACIN <=0.25 SENSITIVE Sensitive     GENTAMICIN <=1 SENSITIVE Sensitive     IMIPENEM 1 SENSITIVE Sensitive     NITROFURANTOIN 64 INTERMEDIATE Intermediate     TRIMETH/SULFA <=20 SENSITIVE Sensitive     PIP/TAZO >=128 RESISTANT Resistant     * >=100,000 COLONIES/mL ENTEROBACTER AEROGENES  Resp Panel by RT-PCR (Flu A&B, Covid) Nasopharyngeal Swab     Status: None   Collection Time: 08/03/21  7:56 PM   Specimen: Nasopharyngeal Swab; Nasopharyngeal(NP) swabs in vial transport medium  Result Value Ref Range Status   SARS Coronavirus 2 by RT PCR NEGATIVE NEGATIVE Final    Comment: (NOTE) SARS-CoV-2 target nucleic acids are NOT DETECTED.  The SARS-CoV-2 RNA is generally detectable in upper respiratory specimens during the acute phase of infection. The lowest concentration of SARS-CoV-2 viral copies this assay can detect is 138 copies/mL. A negative result does not preclude SARS-Cov-2 infection and should not be used as the sole basis for treatment or other patient management decisions. A negative result may occur with  improper specimen collection/handling, submission of specimen other than  nasopharyngeal swab, presence of viral mutation(s) within the areas  targeted by this assay, and inadequate number of viral copies(<138 copies/mL). A negative result must be combined with clinical observations, patient history, and epidemiological information. The expected result is Negative.  Fact Sheet for Patients:  BloggerCourse.com  Fact Sheet for Healthcare Providers:  SeriousBroker.it  This test is no t yet approved or cleared by the [place] FDA and  has been authorized for detection and/or diagnosis of SARS-CoV-2 by FDA under an Emergency Use Authorization (EUA). This EUA will remain  in effect (meaning this test can be used) for the duration of the COVID-19 declaration under Section 564(b)(1) of the Act, 21 U.S.C.section 360bbb-3(b)(1), unless the authorization is terminated  or revoked sooner.       Influenza A by PCR NEGATIVE NEGATIVE Final   Influenza B by PCR NEGATIVE NEGATIVE Final    Comment: (NOTE) The Xpert Xpress SARS-CoV-2/FLU/RSV plus assay is intended as an aid in the diagnosis of influenza from Nasopharyngeal swab specimens and should not be used as a sole basis for treatment. Nasal washings and aspirates are unacceptable for Xpert Xpress SARS-CoV-2/FLU/RSV testing.  Fact Sheet for Patients: BloggerCourse.com  Fact Sheet for Healthcare Providers: SeriousBroker.it  This test is not yet approved or cleared by the [place] FDA and has been authorized for detection and/or diagnosis of SARS-CoV-2 by FDA under an Emergency Use Authorization (EUA). This EUA will remain in effect (meaning this test can be used) for the duration of the COVID-19 declaration under Section 564(b)(1) of the Act, 21 U.S.C. section 360bbb-3(b)(1), unless the authorization is terminated or revoked.  Performed at [facility], [address], [place], [place] [address]   Blood Culture (routine x 2)     Status: None (Preliminary result)    Collection Time: 08/03/21  8:00 PM   Specimen: BLOOD  Result Value Ref Range Status   Specimen Description BLOOD SITE NOT SPECIFIED  Final   Special Requests   Final    BOTTLES DRAWN AEROBIC AND ANAEROBIC Blood Culture adequate volume   Culture   Final    NO GROWTH 3 DAYS Performed at [facility], [address] [place], [place] [address]    Report Status PENDING  Incomplete  Blood Culture (routine x 2)     Status: None (Preliminary result)   Collection Time: 08/03/21  8:00 PM   Specimen: BLOOD  Result Value Ref Range Status   Specimen Description BLOOD SITE NOT SPECIFIED  Final   Special Requests   Final    BOTTLES DRAWN AEROBIC ONLY Blood Culture adequate volume   Culture   Final    NO GROWTH 3 DAYS Performed at [facility], [address] [place], [place] [address]    Report Status PENDING  Incomplete     Labs: BNP (last 3 results) No results for input(s): BNP in the last 8760 hours. Basic Metabolic Panel: Recent Labs  Lab 08/03/21 2000 08/04/21 0347 08/06/21 0408  NA 133* 132* 138  K 3.8 3.6 3.9  CL 99 98 104  CO2 21* 23 25  GLUCOSE 109* 143* 97  BUN 11 12 10   CREATININE 1.18 1.01 0.62  CALCIUM 9.0 8.5* 8.6*   Liver Function Tests: Recent Labs  Lab 08/03/21 2000  AST 15  ALT 12  ALKPHOS 73  BILITOT 1.0  PROT 7.8  ALBUMIN 3.3*   No results for input(s): LIPASE, AMYLASE in the last 168 hours. No results for input(s):  AMMONIA in the last 168 hours. CBC: Recent Labs  Lab 08/03/21 2000 08/04/21 0347 08/06/21 0408  WBC 13.6* 18.2* 10.6*  NEUTROABS 10.7*  --   --   HGB 13.0 11.2* 11.5*  HCT 39.6 33.1* 34.6*  MCV 90.4 89.5 88.7  PLT 426* 338 325   Cardiac Enzymes: No results for input(s): CKTOTAL, CKMB, CKMBINDEX, TROPONINI in the last 168 hours. BNP: Invalid input(s): POCBNP CBG: No results for input(s): GLUCAP in the last 168 hours. D-Dimer No results for input(s): DDIMER in the last 72 hours. Hgb A1c No results for  input(s): HGBA1C in the last 72 hours. Lipid Profile No results for input(s): CHOL, HDL, LDLCALC, TRIG, CHOLHDL, LDLDIRECT in the last 72 hours. Thyroid function studies No results for input(s): TSH, T4TOTAL, T3FREE, THYROIDAB in the last 72 hours.  Invalid input(s): FREET3 Anemia work up No results for input(s): VITAMINB12, FOLATE, FERRITIN, TIBC, IRON, RETICCTPCT in the last 72 hours. Urinalysis    Component Value Date/Time   COLORURINE AMBER (A) 08/03/2021 1945   APPEARANCEUR CLOUDY (A) 08/03/2021 1945   LABSPEC 1.018 08/03/2021 1945   PHURINE 6.0 08/03/2021 1945   GLUCOSEU NEGATIVE 08/03/2021 1945   HGBUR MODERATE (A) 08/03/2021 1945   BILIRUBINUR NEGATIVE 08/03/2021 1945   KETONESUR 80 (A) 08/03/2021 1945   PROTEINUR 100 (A) 08/03/2021 1945   NITRITE NEGATIVE 08/03/2021 1945   LEUKOCYTESUR LARGE (A) 08/03/2021 1945   Sepsis Labs Invalid input(s): PROCALCITONIN,  WBC,  LACTICIDVEN Microbiology Recent Results (from the past 240 hour(s))  Urine Culture     Status: Abnormal   Collection Time: 08/03/21  7:45 PM   Specimen: In/Out Cath Urine  Result Value Ref Range Status   Specimen Description IN/OUT CATH URINE  Final   Special Requests   Final    NONE Performed at [facility], [address] [place], [place] [address]    Culture >=100,000 COLONIES/mL ENTEROBACTER AEROGENES (A)  Final   Report Status 08/06/2021 FINAL  Final   Organism ID, Bacteria ENTEROBACTER AEROGENES (A)  Final      Susceptibility   Enterobacter aerogenes - MIC*    CEFAZOLIN >=64 RESISTANT Resistant     CEFEPIME <=0.12 SENSITIVE Sensitive     CEFTRIAXONE 8 RESISTANT Resistant     CIPROFLOXACIN <=0.25 SENSITIVE Sensitive     GENTAMICIN <=1 SENSITIVE Sensitive     IMIPENEM 1 SENSITIVE Sensitive     NITROFURANTOIN 64 INTERMEDIATE Intermediate     TRIMETH/SULFA <=20 SENSITIVE Sensitive     PIP/TAZO >=128 RESISTANT Resistant     * >=100,000 COLONIES/mL ENTEROBACTER AEROGENES  Resp Panel by  RT-PCR (Flu A&B, Covid) Nasopharyngeal Swab     Status: None   Collection Time: 08/03/21  7:56 PM   Specimen: Nasopharyngeal Swab; Nasopharyngeal(NP) swabs in vial transport medium  Result Value Ref Range Status   SARS Coronavirus 2 by RT PCR NEGATIVE NEGATIVE Final    Comment: (NOTE) SARS-CoV-2 target nucleic acids are NOT DETECTED.  The SARS-CoV-2 RNA is generally detectable in upper respiratory specimens during the acute phase of infection. The lowest concentration of SARS-CoV-2 viral copies this assay can detect is 138 copies/mL. A negative result does not preclude SARS-Cov-2 infection and should not be used as the sole basis for treatment or other patient management decisions. A negative result may occur with  improper specimen collection/handling, submission of specimen other than nasopharyngeal swab, presence of viral mutation(s) within the areas targeted by this assay, and inadequate number of viral copies(<138 copies/mL). A negative  result must be combined with clinical observations, patient history, and epidemiological information. The expected result is Negative.  Fact Sheet for Patients:  EntrepreneurPulse.com.au  Fact Sheet for Healthcare Providers:  IncredibleEmployment.be  This test is no t yet approved or cleared by the [place] FDA and  has been authorized for detection and/or diagnosis of SARS-CoV-2 by FDA under an Emergency Use Authorization (EUA). This EUA will remain  in effect (meaning this test can be used) for the duration of the COVID-19 declaration under Section 564(b)(1) of the Act, 21 U.S.C.section 360bbb-3(b)(1), unless the authorization is terminated  or revoked sooner.       Influenza A by PCR NEGATIVE NEGATIVE Final   Influenza B by PCR NEGATIVE NEGATIVE Final    Comment: (NOTE) The Xpert Xpress SARS-CoV-2/FLU/RSV plus assay is intended as an aid in the diagnosis of influenza from Nasopharyngeal swab  specimens and should not be used as a sole basis for treatment. Nasal washings and aspirates are unacceptable for Xpert Xpress SARS-CoV-2/FLU/RSV testing.  Fact Sheet for Patients: EntrepreneurPulse.com.au  Fact Sheet for Healthcare Providers: IncredibleEmployment.be  This test is not yet approved or cleared by the [place] FDA and has been authorized for detection and/or diagnosis of SARS-CoV-2 by FDA under an Emergency Use Authorization (EUA). This EUA will remain in effect (meaning this test can be used) for the duration of the COVID-19 declaration under Section 564(b)(1) of the Act, 21 U.S.C. section 360bbb-3(b)(1), unless the authorization is terminated or revoked.  Performed at [facility], [place] [address] [place], [place] [address]   Blood Culture (routine x 2)     Status: None (Preliminary result)   Collection Time: 08/03/21  8:00 PM   Specimen: BLOOD  Result Value Ref Range Status   Specimen Description BLOOD SITE NOT SPECIFIED  Final   Special Requests   Final    BOTTLES DRAWN AEROBIC AND ANAEROBIC Blood Culture adequate volume   Culture   Final    NO GROWTH 3 DAYS Performed at [facility], [address] [place], [place] [address]    Report Status PENDING  Incomplete  Blood Culture (routine x 2)     Status: None (Preliminary result)   Collection Time: 08/03/21  8:00 PM   Specimen: BLOOD  Result Value Ref Range Status   Specimen Description BLOOD SITE NOT SPECIFIED  Final   Special Requests   Final    BOTTLES DRAWN AEROBIC ONLY Blood Culture adequate volume   Culture   Final    NO GROWTH 3 DAYS Performed at [facility], [address] [place], [place] [address]    Report Status PENDING  Incomplete     Time coordinating discharge: 35 minutes  SIGNED:   Cordelia Poche, MD [facility] 08/06/2021, 2:29 PM

## 2021-08-06 NOTE — Plan of Care (Signed)
°  Problem: Nutrition: °Goal: Adequate nutrition will be maintained °Outcome: Progressing °  °Problem: Coping: °Goal: Level of anxiety will decrease °Outcome: Progressing °  °Problem: Safety: °Goal: Ability to remain free from injury will improve °Outcome: Progressing °  °

## 2021-08-06 NOTE — Assessment & Plan Note (Signed)
Dulcolax suppository and enema given.

## 2021-08-06 NOTE — Progress Notes (Signed)
PROGRESS NOTE    Jordan Patrick  [id number] DOB: 08-22-1971 DOA: 08/03/2021 PCP: Merryl Hacker, No   Brief Narrative: Jordan Patrick is a 50 y.o. male with recent history of right ureteral stent placement secondary to hydronephrosis. Patient presented secondary to nausea/vomiting and abdominal discomfort with evidence of malpositioned right ureteral stent with associated hydronephrosis/hydroureter in addition to UTI. Empiric antibiotics initiated and patient had stent exchange performed by Urology.   Assessment & Plan:   * Sepsis ([place]) Present on admission. Secondary to UTI. Empirically started on Ceftriaxone. Blood cultures with no growth to date. Urine culture with enterobacter aerogenes. Leukocytosis improved with antibiotics. Urine culture significant for enterobacter aerogenes. Patient transitioned to Ciprofloxacin to complete a 7 day course of antibiotics, however he developed an allergic reaction to Ciprofloxacin. Ciprofloxacin switched to Bactrim DS.  UTI (urinary tract infection) In setting of right ureteral stent. Management as mentioned above.  Displacement of ureteral stent [facility]) Urology. Right ureteral stent exchanged on 11/26.  Allergic reaction Severe itching with forehead rash. Itching improved with Benadryl 25 mg IV. Solu-medrol ordered as well -Continue Benadryl IV prn -Ciprofloxacin added to allergy list  Constipation Dulcolax suppository and enema given.  Hydronephrosis of right kidney In setting of displaced right ureteral stent. Exchange performed as mentioned above.     DVT prophylaxis: Heparin subq Code Status:   Code Status: Full Code Family Communication: None at bedside Disposition Plan: Discharge home likely in 24 hours pending observation after allergic reaction   Consultants:  Urology  Procedures:  UROLOGY PROCEDURE (08/04/2021) Cystoscopy right ureteral stent exchange (6Fr x 26 cm no tether) right retrograde pyelography with  interpretation  Right diagnostic ureteroscopy   Antimicrobials: Ceftriaxone IV    Subjective: Feeling okay this morning. This afternoon, patient developed severe itching and facial flushing after getting ciprofloxacin.  Objective: Vitals:   08/05/21 1231 08/05/21 2135 08/06/21 1203 08/06/21 1449  BP: 107/68 112/74 100/68 105/80  Pulse: 88 82 89 86  Resp: 16 20 18    Temp: 98.2 F (36.8 C) 98.5 F (36.9 C) 98.1 F (36.7 C) 97.8 F (36.6 C)  TempSrc: Oral Oral  Oral  SpO2: 96% 98% 97% 98%  Weight:      Height:        Intake/Output Summary (Last 24 hours) at 08/06/2021 1509 Last data filed at 08/06/2021 0541 Gross per 24 hour  Intake 200 ml  Output 2575 ml  Net -2375 ml    Filed Weights   08/03/21 1917  Weight: 74.2 kg    Examination:  General exam: Appears calm and comfortable and in no acute distress. Conversant Respiratory: Clear to auscultation. Respiratory effort normal with no intercostal retractions or use of accessory muscles Cardiovascular: S1 & S2 heard, RRR. No murmurs, rubs, gallops or clicks. No edema Gastrointestinal: Abdomen is non-distended, soft and tender in RLQ/LLQ. No masses felt. Normal bowel sounds heard Neurologic: No focal neurological deficits Musculoskeletal: No calf tenderness Skin: No cyanosis. Macular forehead rash. No hand/foot rashes noted but there does seem to be some hyperemia likely from patient scratching Psychiatry: Alert and oriented. Memory intact. Mood & affect appropriate    Data Reviewed: I have personally reviewed following labs and imaging studies  CBC Lab Results  Component Value Date   WBC 10.6 (H) 08/06/2021   RBC 3.90 (L) 08/06/2021   HGB 11.5 (L) 08/06/2021   HCT 34.6 (L) 08/06/2021   MCV 88.7 08/06/2021   MCH 29.5 08/06/2021   PLT 325 08/06/2021   MCHC 33.2  08/06/2021   RDW 13.0 08/06/2021   LYMPHSABS 1.5 08/03/2021   MONOABS 1.3 (H) 08/03/2021   EOSABS 0.0 08/03/2021   BASOSABS 0.0 08/03/2021      Last metabolic panel Lab Results  Component Value Date   NA 138 08/06/2021   K 3.9 08/06/2021   CL 104 08/06/2021   CO2 25 08/06/2021   BUN 10 08/06/2021   CREATININE 0.62 08/06/2021   GLUCOSE 97 08/06/2021   GFRNONAA >60 08/06/2021   GFRAA >90 05/21/2012   CALCIUM 8.6 (L) 08/06/2021   PROT 7.8 08/03/2021   ALBUMIN 3.3 (L) 08/03/2021   BILITOT 1.0 08/03/2021   ALKPHOS 73 08/03/2021   AST 15 08/03/2021   ALT 12 08/03/2021   ANIONGAP 9 08/06/2021    CBG (last 3)  No results for input(s): GLUCAP in the last 72 hours.   GFR: Estimated Creatinine Clearance: 104.1 mL/min (by C-G formula based on SCr of 0.62 mg/dL).  Coagulation Profile: Recent Labs  Lab 08/03/21 2000  INR 1.1     Recent Results (from the past 240 hour(s))  Urine Culture     Status: Abnormal   Collection Time: 08/03/21  7:45 PM   Specimen: In/Out Cath Urine  Result Value Ref Range Status   Specimen Description IN/OUT CATH URINE  Final   Special Requests   Final    NONE Performed at [facility], [address], [place], [place] [address]    Culture >=100,000 COLONIES/mL ENTEROBACTER AEROGENES (A)  Final   Report Status 08/06/2021 FINAL  Final   Organism ID, Bacteria ENTEROBACTER AEROGENES (A)  Final      Susceptibility   Enterobacter aerogenes - MIC*    CEFAZOLIN >=64 RESISTANT Resistant     CEFEPIME <=0.12 SENSITIVE Sensitive     CEFTRIAXONE 8 RESISTANT Resistant     CIPROFLOXACIN <=0.25 SENSITIVE Sensitive     GENTAMICIN <=1 SENSITIVE Sensitive     IMIPENEM 1 SENSITIVE Sensitive     NITROFURANTOIN 64 INTERMEDIATE Intermediate     TRIMETH/SULFA <=20 SENSITIVE Sensitive     PIP/TAZO >=128 RESISTANT Resistant     * >=100,000 COLONIES/mL ENTEROBACTER AEROGENES  Resp Panel by RT-PCR (Flu A&B, Covid) Nasopharyngeal Swab     Status: None   Collection Time: 08/03/21  7:56 PM   Specimen: Nasopharyngeal Swab; Nasopharyngeal(NP) swabs in vial transport medium  Result Value Ref Range  Status   SARS Coronavirus 2 by RT PCR NEGATIVE NEGATIVE Final    Comment: (NOTE) SARS-CoV-2 target nucleic acids are NOT DETECTED.  The SARS-CoV-2 RNA is generally detectable in upper respiratory specimens during the acute phase of infection. The lowest concentration of SARS-CoV-2 viral copies this assay can detect is 138 copies/mL. A negative result does not preclude SARS-Cov-2 infection and should not be used as the sole basis for treatment or other patient management decisions. A negative result may occur with  improper specimen collection/handling, submission of specimen other than nasopharyngeal swab, presence of viral mutation(s) within the areas targeted by this assay, and inadequate number of viral copies(<138 copies/mL). A negative result must be combined with clinical observations, patient history, and epidemiological information. The expected result is Negative.  Fact Sheet for Patients:  BloggerCourse.com  Fact Sheet for Healthcare Providers:  SeriousBroker.it  This test is no t yet approved or cleared by the [place] FDA and  has been authorized for detection and/or diagnosis of SARS-CoV-2 by FDA under an Emergency Use Authorization (EUA). This EUA will remain  in effect (meaning this  test can be used) for the duration of the COVID-19 declaration under Section 564(b)(1) of the Act, 21 U.S.C.section 360bbb-3(b)(1), unless the authorization is terminated  or revoked sooner.       Influenza A by PCR NEGATIVE NEGATIVE Final   Influenza B by PCR NEGATIVE NEGATIVE Final    Comment: (NOTE) The Xpert Xpress SARS-CoV-2/FLU/RSV plus assay is intended as an aid in the diagnosis of influenza from Nasopharyngeal swab specimens and should not be used as a sole basis for treatment. Nasal washings and aspirates are unacceptable for Xpert Xpress SARS-CoV-2/FLU/RSV testing.  Fact Sheet for  Patients: EntrepreneurPulse.com.au  Fact Sheet for Healthcare Providers: IncredibleEmployment.be  This test is not yet approved or cleared by the [place] FDA and has been authorized for detection and/or diagnosis of SARS-CoV-2 by FDA under an Emergency Use Authorization (EUA). This EUA will remain in effect (meaning this test can be used) for the duration of the COVID-19 declaration under Section 564(b)(1) of the Act, 21 U.S.C. section 360bbb-3(b)(1), unless the authorization is terminated or revoked.  Performed at [facility], [place] [address], [place], [place] [address]   Blood Culture (routine x 2)     Status: None (Preliminary result)   Collection Time: 08/03/21  8:00 PM   Specimen: BLOOD  Result Value Ref Range Status   Specimen Description BLOOD SITE NOT SPECIFIED  Final   Special Requests   Final    BOTTLES DRAWN AEROBIC AND ANAEROBIC Blood Culture adequate volume   Culture   Final    NO GROWTH 3 DAYS Performed at [facility], [address], [place], [place] [address]    Report Status PENDING  Incomplete  Blood Culture (routine x 2)     Status: None (Preliminary result)   Collection Time: 08/03/21  8:00 PM   Specimen: BLOOD  Result Value Ref Range Status   Specimen Description BLOOD SITE NOT SPECIFIED  Final   Special Requests   Final    BOTTLES DRAWN AEROBIC ONLY Blood Culture adequate volume   Culture   Final    NO GROWTH 3 DAYS Performed at [facility], [address], [place], [place] [address]    Report Status PENDING  Incomplete         Radiology Studies: CT Angio Abd/Pel w/ and/or w/o  Result Date: 08/05/2021 CLINICAL DATA:  Right UPJ obstruction. Evaluate for crossing vessel to explain the etiology of the patient's obstruction. EXAM: CTA ABDOMEN AND PELVIS WITHOUT AND WITH CONTRAST TECHNIQUE: Multidetector CT imaging of the abdomen and pelvis was performed using the standard protocol during  bolus administration of intravenous contrast. Multiplanar reconstructed images and MIPs were obtained and reviewed to evaluate the vascular anatomy. CONTRAST:  163mL OMNIPAQUE IOHEXOL 350 MG/ML SOLN COMPARISON:  CT abdomen pelvis-08/03/2021; 06/18/2021 Intraoperative images during right-sided double-J ureteral stent exchange-08/04/2021 FINDINGS: VASCULAR Aorta: There is no significant atherosclerotic plaque within the normal caliber abdominal aorta which is widely patent without hemodynamically significant narrowing. No evidence of dissection, vessel irregularity or perivascular stranding. Celiac: Widely patent without a hemodynamically significant narrowing. SMA: Widely patent without hemodynamically significant narrowing. A replaced right hepatic artery is noted to arise from the proximal SMA. The distal tributaries of the SMA appear widely patent without discrete intraluminal filling defect to suggest distal embolism. Renals: Solitary duplicated bilaterally; there are small accessory renal arteries seen bilaterally both of which supply the superior poles of the right and left kidneys. All renal arteries are widely patent without a  hemodynamically significant narrowing. No vessel irregularity to suggest FMD. No evidence of aneurysm or AV malformation. Note, the location of the suspected UPJ stenosis on initial noncontrast CT scan of the abdomen pelvis performed 06/18/2021 is inferior to the right renal arteries as well as the right renal vein. IMA: Widely patent without a hemodynamically significant narrowing. There is early collateral supply from the SMA. Inflow: There is a very minimal amount of calcified atherosclerotic plaque involving the distal aspect of the right common iliac artery, not resulting in hemodynamically significant stenosis. The bilateral internal and external iliac arteries are of normal caliber and widely patent without hemodynamically significant narrowing. Proximal Outflow: The bilateral  common femoral and imaged portions of the bilateral deep and superficial femoral arteries are of normal caliber and widely patent without a hemodynamically significant narrowing. Veins: The IVC and pelvic venous systems appear widely patent. Note, the bilateral renal veins are widely patent and of conventional configuration. Review of the MIP images confirms the above findings. _________________________________________________________ NON-VASCULAR Lower chest: Limited visualization of the lower thorax demonstrates minimal dependent subpleural ground-glass atelectasis. No discrete focal airspace opacities. Normal heart size.  No pericardial effusion. Hepatobiliary: Normal hepatic contour. There is a punctate subcentimeter hypoattenuating lesion within the dome of the left lobe of the liver (image 20, series 4), which is too small to accurately characterize though favored to represent a hepatic cyst. No discrete worrisome hyperenhancing hepatic lesions. Normal appearance of the gallbladder given degree distention. No radiopaque gallstones. No intra extrahepatic bili duct dilatation. No ascites. Pancreas: Normal appearance of the pancreas. Spleen: Normal appearance of the spleen. Note is made of a tiny splenule about the anterior inferior aspect of the spleen. Adrenals/Urinary Tract: There is mild slightly delayed enhancement of the right kidney in comparison to the left with associated mild striated nephrogram appearance (representative axial images 31, 37 and 44, series 11; coronal images 53, 62 and 67, series 14). No definite evidence of nephrolithiasis on this postcontrast examination. No discrete worrisome lesions. Interval exchange/repositioning of right-sided double-J ureteral stent with superior coil now better positioned within the superior aspect of the right renal collecting system and inferior coil within the urinary bladder. This finding is associated with near complete resolution of previously noted mild  residual right-sided pelvicaliectasis. Note is made of a tiny focus of intraluminal air about the right renal pelvis at location of previously suboptimally positioned double-J stent (image 64, series 4), the sequela of recent stent exchange and repositioning as demonstrated on intraoperative radiographs performed 08/04/2021. No evidence of left-sided urinary obstruction. There is a tiny focus of air within the urinary bladder, likely the sequela of recent right-sided double-J ureteral stent exchange. Otherwise, normal appearance of the urinary bladder. Stomach/Bowel: Large colonic stool burden, particularly within the sigmoid colon and rectal vault, without evidence of enteric obstruction. Normal appearance of the terminal ileum and the retrocecal appendix. No evidence of hiatal hernia. No discrete areas of bowel wall thickening. No pneumoperitoneum, pneumatosis or portal venous gas. Lymphatic: Scattered retroperitoneal lymph nodes are numerous though individually not enlarged by size criteria with index aortocaval lymph node measuring 0.6 cm in greatest oblique short axis diameter (image 47, series 11) and index retrocaval lymph node measuring 0.7 cm in greatest short axis diameter (image 36, series 11), similar to the 06/2021 examination and presumably reactive in etiology. No bulky retroperitoneal, mesenteric, pelvic or inguinal lymphadenopathy Reproductive: Normal appearance the prostate gland. No free fluid the pelvic cul-de-sac. Other: Regional soft tissues appear normal. Musculoskeletal: No acute  or aggressive osseous abnormalities. 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 Stigmata of dish within the lower thoracic spine. Mild degenerative change of the bilateral hips with joint space loss, subchondral sclerosis and osteophytosis. IMPRESSION: 1. Slightly delayed enhancement of the right kidney with associated striated nephrogram appearance  as could be seen in the setting of pyelonephritis. No definable/drainable renal abscess. 2. Interval repositioning/exchange of right-sided double-J ureteral stent with superior coil now better positioned within the superior aspect of the right renal collecting system. No evidence of residual right-sided pelvicaliectasis or ureterectasis. 3. No evidence of a vascular cause to suggest the etiology of the patient's right UPJ stenosis/obstruction. Specifically, the right renal arteries and the right renal vein are located superior to the transition of the UPJ stenosis seen on initial noncontrast CT scan performed 06/18/2021. 4. Large colonic stool burden, particularly within the distal sigmoid colon and rectal vault, without evidence of enteric obstruction. 5. Incidentally noted bilateral L5 pars defects with associated grade 1 anterolisthesis of L5 upon S1. Electronically Signed   By: Simonne Come M.D.   On: 08/05/2021 14:46        Scheduled Meds:  darifenacin  15 mg Oral Daily   docusate sodium  100 mg Oral BID   feeding supplement  237 mL Oral BID BM   heparin  5,000 Units Subcutaneous Q8H   methylPREDNISolone (SOLU-MEDROL) injection  40 mg Intravenous Once   polyethylene glycol  17 g Oral Daily   senna  1 tablet Oral BID   sulfamethoxazole-trimethoprim  1 tablet Oral Q12H   tamsulosin  0.4 mg Oral Daily   Continuous Infusions:     LOS: 2 days     Jacquelin Hawking, MD [facility] 08/06/2021, 3:09 PM  If 7PM-7AM, please contact night-coverage [email or web address]

## 2021-08-07 ENCOUNTER — Encounter (HOSPITAL_COMMUNITY): Payer: Self-pay | Admitting: Urology

## 2021-08-07 MED ORDER — SULFAMETHOXAZOLE-TRIMETHOPRIM 800-160 MG PO TABS: 1.0000 | ORAL_TABLET | Freq: Two times a day (BID) | ORAL | 0 refills | Status: AC

## 2021-08-07 NOTE — Plan of Care (Signed)
  Problem: Education: Goal: Knowledge of General Education information will improve Description: Including pain rating scale, medication(s)/side effects and non-pharmacologic comfort measures Outcome: Adequate for Discharge   Problem: Health Behavior/Discharge Planning: Goal: Ability to manage health-related needs will improve Outcome: Adequate for Discharge   Problem: Clinical Measurements: Goal: Ability to maintain clinical measurements within normal limits will improve Outcome: Adequate for Discharge Goal: Will remain free from infection Outcome: Adequate for Discharge Goal: Diagnostic test results will improve Outcome: Adequate for Discharge Goal: Respiratory complications will improve 08/07/2021 1113 by Val Eagle, RN Outcome: Adequate for Discharge 08/07/2021 1109 by Val Eagle, RN Outcome: Progressing Goal: Cardiovascular complication will be avoided 08/07/2021 1113 by Val Eagle, RN Outcome: Adequate for Discharge 08/07/2021 1109 by Val Eagle, RN Outcome: Progressing   Problem: Activity: Goal: Risk for activity intolerance will decrease 08/07/2021 1113 by Val Eagle, RN Outcome: Adequate for Discharge 08/07/2021 1109 by Val Eagle, RN Outcome: Progressing   Problem: Nutrition: Goal: Adequate nutrition will be maintained Outcome: Adequate for Discharge   Problem: Coping: Goal: Level of anxiety will decrease 08/07/2021 1113 by Val Eagle, RN Outcome: Adequate for Discharge 08/07/2021 1109 by Val Eagle, RN Outcome: Progressing   Problem: Elimination: Goal: Will not experience complications related to bowel motility Outcome: Adequate for Discharge Goal: Will not experience complications related to urinary retention Outcome: Adequate for Discharge   Problem: Pain Managment: Goal: General experience of comfort will improve Outcome: Adequate for Discharge   Problem: Safety: Goal: Ability to remain free from  injury will improve Outcome: Adequate for Discharge   Problem: Skin Integrity: Goal: Risk for impaired skin integrity will decrease Outcome: Adequate for Discharge

## 2021-08-07 NOTE — TOC Transition Note (Signed)
Transition of Care [facility]) - CM/SW Discharge Note   Patient Details  Name: Jordan Patrick MRN: [id number] Date of Birth: 09-03-1971  [facility] CM/SW Contact:  Golda Acre, RN Phone Number: 08/07/2021, 12:25 PM   Clinical Narrative:    Dcd to home with self care ordwers checked no toc needs.   Final next level of care: Home/Self Care Barriers to Discharge: Barriers Resolved   Patient Goals and CMS Choice Patient states their goals for this hospitalization and ongoing recovery are:: not asked speaks spanish      Discharge Placement                       Discharge Plan and Services                                     Social Determinants of Health (SDOH) Interventions     Readmission Risk Interventions No flowsheet data found.

## 2021-08-07 NOTE — Discharge Summary (Signed)
Physician Discharge Summary  Jordan Patrick JXB:[id number] DOB: [birth year] DOA: 08/03/2021  PCP: Pcp, No  Admit date: 08/03/2021 Discharge date: 08/07/2021  Admitted From: Home Disposition: Home  Recommendations for Outpatient Follow-up:  Follow up with Urology Please obtain BMP/CBC in one week Please follow up on the following pending results: None  Home Health: None Equipment/Devices: None  Discharge Condition: Stable CODE STATUS: Full code Diet recommendation: Regular diet   Brief/Interim Summary:  Admission HPI written by Eduard ClosArshad N Kakrakandy, MD   HPI: Jordan Patrick is a 50 y.o. male with history of stent placement for right-sided hydronephrosis in October 1 week subsequent which stent was exchanged 3 weeks ago presents to the ER because of persistent pain radiating from the right flank to the groin and nausea vomiting.  Unable to keep anything.  Patient also has been having fever chills.   Hospital course:  * Sepsis [facility](HCC) Present on admission. Secondary to UTI. Empirically started on Ceftriaxone. Blood cultures with no growth to date. Urine culture with enterobacter aerogenes. Leukocytosis improved with antibiotics. Urine culture significant for enterobacter aerogenes. Patient transitioned to Ciprofloxacin to complete a 7 day course of antibiotics, however he developed an allergic reaction to Ciprofloxacin. Ciprofloxacin switched to Bactrim DS.  UTI (urinary tract infection) In setting of right ureteral stent. Management as mentioned above.  Displacement of ureteral stent [facility](HCC) Urology. Right ureteral stent exchanged on 11/26.  Allergic reaction Severe itching with forehead rash. Itching improved with Benadryl 25 mg IV. Solu-medrol ordered as well -Continue Benadryl IV prn -Ciprofloxacin added to allergy list  Constipation Dulcolax suppository and enema given.  Hydronephrosis of right kidney In setting of displaced right ureteral stent.  Exchange performed as mentioned above.   Discharge Diagnoses:  Principal Problem:   Sepsis [facility](HCC) Active Problems:   Displacement of ureteral stent (HCC)   UTI (urinary tract infection)   Hydronephrosis of right kidney   Constipation   Allergic reaction    Discharge Instructions   Allergies as of 08/07/2021       Reactions   Cipro [ciprofloxacin Hcl] Itching, Rash   Hives from head to toe, and itching itching of the hands and feet.        Medication List     TAKE these medications    feeding supplement Liqd Take 237 mLs by mouth 2 (two) times daily between meals.   polyethylene glycol 17 g packet Commonly known as: MIRALAX / GLYCOLAX Take 17 g by mouth daily.   senna-docusate 8.6-50 MG tablet Commonly known as: Senokot-S Take 1 tablet by mouth at bedtime as needed for mild constipation.   sulfamethoxazole-trimethoprim 800-160 MG tablet Commonly known as: BACTRIM DS Take 1 tablet by mouth every 12 (twelve) hours for 6 days.   tamsulosin 0.4 MG Caps capsule Commonly known as: FLOMAX Take 1 capsule (0.4 mg total) by mouth daily. What changed: Another medication with the same name was removed. Continue taking this medication, and follow the directions you see here.   traMADol 50 MG tablet Commonly known as: Ultram Take 1 tablet (50 mg total) by mouth every 6 (six) hours as needed.        Follow-up Information     Noel ChristmasPace, Maryellen D, MD. Schedule an appointment as soon as possible for a visit.   Specialty: Urology Why: For hospital follow-up Contact information: [address] [place] [address] [phone]                Allergies  Allergen Reactions   Cipro [Ciprofloxacin Hcl] Itching and Rash    Hives from head to toe, and itching itching of the hands and feet.    Consultations: Urology   Procedures/Studies: [facility] Chest Port 1 View  Result Date: 08/03/2021 CLINICAL DATA:  Questionable sepsis. EXAM: PORTABLE CHEST 1 VIEW  COMPARISON:  Chest radiograph dated 07/17/2020. FINDINGS: The heart size and mediastinal contours are within normal limits. Both lungs are clear. The visualized skeletal structures are unremarkable. IMPRESSION: No active disease. Electronically Signed   By: Elgie Collard M.D.   On: 08/03/2021 20:13   DG C-Arm 1-60 Min-No Report  Result Date: 08/04/2021 Fluoroscopy was utilized by the requesting physician.  No radiographic interpretation.   CT Renal Stone Study  Result Date: 08/03/2021 CLINICAL DATA:  Abdominal pain. EXAM: CT ABDOMEN AND PELVIS WITHOUT CONTRAST TECHNIQUE: Multidetector CT imaging of the abdomen and pelvis was performed following the standard protocol without IV contrast. COMPARISON:  June 18, 2021 FINDINGS: Lower chest: No acute abnormality. Hepatobiliary: No focal liver abnormality is seen. No gallstones, gallbladder wall thickening, or biliary dilatation. Pancreas: Unremarkable. No pancreatic ductal dilatation or surrounding inflammatory changes. Spleen: Normal in size without focal abnormality. Adrenals/Urinary Tract: Adrenal glands are unremarkable. Kidneys are normal in size, without focal lesions. A right-sided endo ureteral stent is in place. The proximal portion of the stent is seen within the proximal right ureter. The distal aspect of the stent is seen within the lumen of the urinary bladder. Moderate severity right-sided hydronephrosis and hydroureter are seen to the level of the proximal portion of the endo ureteral stent. Bladder is unremarkable. Stomach/Bowel: Stomach is within normal limits. Appendix appears normal. No evidence of bowel wall thickening, distention, or inflammatory changes. Vascular/Lymphatic: No significant vascular findings are present. No enlarged abdominal or pelvic lymph nodes. Reproductive: Prostate is unremarkable. Other: No abdominal wall hernia or abnormality. No abdominopelvic ascites. Musculoskeletal: No acute or significant osseous findings.  IMPRESSION: Malpositioned right-sided endo ureteral stent with moderate severity right-sided hydronephrosis and hydroureter. Electronically Signed   By: Aram Candela M.D.   On: 08/03/2021 20:37   CT Angio Abd/Pel w/ and/or w/o  Result Date: 08/05/2021 CLINICAL DATA:  Right UPJ obstruction. Evaluate for crossing vessel to explain the etiology of the patient's obstruction. EXAM: CTA ABDOMEN AND PELVIS WITHOUT AND WITH CONTRAST TECHNIQUE: Multidetector CT imaging of the abdomen and pelvis was performed using the standard protocol during bolus administration of intravenous contrast. Multiplanar reconstructed images and MIPs were obtained and reviewed to evaluate the vascular anatomy. CONTRAST:  OMNIPAQUE IOHEXOL 350 MG/ML SOLN COMPARISON:  CT abdomen pelvis-08/03/2021; 06/18/2021 Intraoperative images during right-sided double-J ureteral stent exchange-08/04/2021 FINDINGS: VASCULAR Aorta: There is no significant atherosclerotic plaque within the normal caliber abdominal aorta which is widely patent without hemodynamically significant narrowing. No evidence of dissection, vessel irregularity or perivascular stranding. Celiac: Widely patent without a hemodynamically significant narrowing. SMA: Widely patent without hemodynamically significant narrowing. A replaced right hepatic artery is noted to arise from the proximal SMA. The distal tributaries of the SMA appear widely patent without discrete intraluminal filling defect to suggest distal embolism. Renals: Solitary duplicated bilaterally; there are small accessory renal arteries seen bilaterally both of which supply the superior poles of the right and left kidneys. All renal arteries are widely patent without a hemodynamically significant narrowing. No vessel irregularity to suggest FMD. No evidence of aneurysm or AV malformation. Note, the location of the suspected UPJ stenosis on initial noncontrast CT scan of the abdomen pelvis performed  06/18/2021 is  inferior to the right renal arteries as well as the right renal vein. IMA: Widely patent without a hemodynamically significant narrowing. There is early collateral supply from the SMA. Inflow: There is a very minimal amount of calcified atherosclerotic plaque involving the distal aspect of the right common iliac artery, not resulting in hemodynamically significant stenosis. The bilateral internal and external iliac arteries are of normal caliber and widely patent without hemodynamically significant narrowing. Proximal Outflow: The bilateral common femoral and imaged portions of the bilateral deep and superficial femoral arteries are of normal caliber and widely patent without a hemodynamically significant narrowing. Veins: The IVC and pelvic venous systems appear widely patent. Note, the bilateral renal veins are widely patent and of conventional configuration. Review of the MIP images confirms the above findings. _________________________________________________________ NON-VASCULAR Lower chest: Limited visualization of the lower thorax demonstrates minimal dependent subpleural ground-glass atelectasis. No discrete focal airspace opacities. Normal heart size.  No pericardial effusion. Hepatobiliary: Normal hepatic contour. There is a punctate subcentimeter hypoattenuating lesion within the dome of the left lobe of the liver (image 20, series 4), which is too small to accurately characterize though favored to represent a hepatic cyst. No discrete worrisome hyperenhancing hepatic lesions. Normal appearance of the gallbladder given degree distention. No radiopaque gallstones. No intra extrahepatic bili duct dilatation. No ascites. Pancreas: Normal appearance of the pancreas. Spleen: Normal appearance of the spleen. Note is made of a tiny splenule about the anterior inferior aspect of the spleen. Adrenals/Urinary Tract: There is mild slightly delayed enhancement of the right kidney in comparison to the left with  associated mild striated nephrogram appearance (representative axial images 31, 37 and 44, series 11; coronal images 53, 62 and 67, series 14). No definite evidence of nephrolithiasis on this postcontrast examination. No discrete worrisome lesions. Interval exchange/repositioning of right-sided double-J ureteral stent with superior coil now better positioned within the superior aspect of the right renal collecting system and inferior coil within the urinary bladder. This finding is associated with near complete resolution of previously noted mild residual right-sided pelvicaliectasis. Note is made of a tiny focus of intraluminal air about the right renal pelvis at location of previously suboptimally positioned double-J stent (image 64, series 4), the sequela of recent stent exchange and repositioning as demonstrated on intraoperative radiographs performed 08/04/2021. No evidence of left-sided urinary obstruction. There is a tiny focus of air within the urinary bladder, likely the sequela of recent right-sided double-J ureteral stent exchange. Otherwise, normal appearance of the urinary bladder. Stomach/Bowel: Large colonic stool burden, particularly within the sigmoid colon and rectal vault, without evidence of enteric obstruction. Normal appearance of the terminal ileum and the retrocecal appendix. No evidence of hiatal hernia. No discrete areas of bowel wall thickening. No pneumoperitoneum, pneumatosis or portal venous gas. Lymphatic: Scattered retroperitoneal lymph nodes are numerous though individually not enlarged by size criteria with index aortocaval lymph node measuring 0.6 cm in greatest oblique short axis diameter (image 47, series 11) and index retrocaval lymph node measuring 0.7 cm in greatest short axis diameter (image 36, series 11), similar to the 06/2021 examination and presumably reactive in etiology. No bulky retroperitoneal, mesenteric, pelvic or inguinal lymphadenopathy Reproductive: Normal  appearance the prostate gland. No free fluid the pelvic cul-de-sac. Other: Regional soft tissues appear normal. Musculoskeletal: No acute or aggressive osseous abnormalities. 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 Stigmata of dish within the lower thoracic spine. Mild degenerative change of  the bilateral hips with joint space loss, subchondral sclerosis and osteophytosis. IMPRESSION: 1. Slightly delayed enhancement of the right kidney with associated striated nephrogram appearance as could be seen in the setting of pyelonephritis. No definable/drainable renal abscess. 2. Interval repositioning/exchange of right-sided double-J ureteral stent with superior coil now better positioned within the superior aspect of the right renal collecting system. No evidence of residual right-sided pelvicaliectasis or ureterectasis. 3. No evidence of a vascular cause to suggest the etiology of the patient's right UPJ stenosis/obstruction. Specifically, the right renal arteries and the right renal vein are located superior to the transition of the UPJ stenosis seen on initial noncontrast CT scan performed 06/18/2021. 4. Large colonic stool burden, particularly within the distal sigmoid colon and rectal vault, without evidence of enteric obstruction. 5. Incidentally noted bilateral L5 pars defects with associated grade 1 anterolisthesis of L5 upon S1. Electronically Signed   By: Sandi Mariscal M.D.   On: 08/05/2021 14:46     Subjective: Patient still has some right sided abdominal pain.  Discharge Exam: Vitals:   08/06/21 2200 08/07/21 0458  BP: 110/75 101/74  Pulse: (!) 102 75  Resp: 16 16  Temp: 98.4 F (36.9 C) 97.8 F (36.6 C)  SpO2: 98% 96%   Vitals:   08/06/21 1449 08/06/21 2035 08/06/21 2200 08/07/21 0458  BP: 105/80 108/73 110/75 101/74  Pulse: 86 (!) 108 (!) 102 75  Resp:  14 16 16   Temp: 97.8 F (36.6 C) 98.5 F (36.9 C) 98.4 F (36.9  C) 97.8 F (36.6 C)  TempSrc: Oral Oral Oral Oral  SpO2: 98% 97% 98% 96%  Weight:      Height:        General: Pt is alert, awake, not in acute distress Cardiovascular: RRR, S1/S2 +, no rubs, no gallops Respiratory: CTA bilaterally, no wheezing, no rhonchi Abdominal: Soft, tenderness in RLQ/LLQ, ND, normal bowel sounds Extremities: no edema, no cyanosis    The results of significant diagnostics from this hospitalization (including imaging, microbiology, ancillary and laboratory) are listed below for reference.     Microbiology: Recent Results (from the past 240 hour(s))  Urine Culture     Status: Abnormal   Collection Time: 08/03/21  7:45 PM   Specimen: In/Out Cath Urine  Result Value Ref Range Status   Specimen Description IN/OUT CATH URINE  Final   Special Requests   Final    NONE Performed at [facility], [address], [place], [place] [address]    Culture >=100,000 COLONIES/mL ENTEROBACTER AEROGENES (A)  Final   Report Status 08/06/2021 FINAL  Final   Organism ID, Bacteria ENTEROBACTER AEROGENES (A)  Final      Susceptibility   Enterobacter aerogenes - MIC*    CEFAZOLIN >=64 RESISTANT Resistant     CEFEPIME <=0.12 SENSITIVE Sensitive     CEFTRIAXONE 8 RESISTANT Resistant     CIPROFLOXACIN <=0.25 SENSITIVE Sensitive     GENTAMICIN <=1 SENSITIVE Sensitive     IMIPENEM 1 SENSITIVE Sensitive     NITROFURANTOIN 64 INTERMEDIATE Intermediate     TRIMETH/SULFA <=20 SENSITIVE Sensitive     PIP/TAZO >=128 RESISTANT Resistant     * >=100,000 COLONIES/mL ENTEROBACTER AEROGENES  Resp Panel by RT-PCR (Flu A&B, Covid) Nasopharyngeal Swab     Status: None   Collection Time: 08/03/21  7:56 PM   Specimen: Nasopharyngeal Swab; Nasopharyngeal(NP) swabs in vial transport medium  Result Value Ref Range Status   SARS Coronavirus 2 by RT PCR NEGATIVE NEGATIVE Final  Comment: (NOTE) SARS-CoV-2 target nucleic acids are NOT DETECTED.  The SARS-CoV-2 RNA is generally  detectable in upper respiratory specimens during the acute phase of infection. The lowest concentration of SARS-CoV-2 viral copies this assay can detect is 138 copies/mL. A negative result does not preclude SARS-Cov-2 infection and should not be used as the sole basis for treatment or other patient management decisions. A negative result may occur with  improper specimen collection/handling, submission of specimen other than nasopharyngeal swab, presence of viral mutation(s) within the areas targeted by this assay, and inadequate number of viral copies(<138 copies/mL). A negative result must be combined with clinical observations, patient history, and epidemiological information. The expected result is Negative.  Fact Sheet for Patients:  EntrepreneurPulse.com.au  Fact Sheet for Healthcare Providers:  IncredibleEmployment.be  This test is no t yet approved or cleared by the [place] FDA and  has been authorized for detection and/or diagnosis of SARS-CoV-2 by FDA under an Emergency Use Authorization (EUA). This EUA will remain  in effect (meaning this test can be used) for the duration of the COVID-19 declaration under Section 564(b)(1) of the Act, 21 U.S.C.section 360bbb-3(b)(1), unless the authorization is terminated  or revoked sooner.       Influenza A by PCR NEGATIVE NEGATIVE Final   Influenza B by PCR NEGATIVE NEGATIVE Final    Comment: (NOTE) The Xpert Xpress SARS-CoV-2/FLU/RSV plus assay is intended as an aid in the diagnosis of influenza from Nasopharyngeal swab specimens and should not be used as a sole basis for treatment. Nasal washings and aspirates are unacceptable for Xpert Xpress SARS-CoV-2/FLU/RSV testing.  Fact Sheet for Patients: EntrepreneurPulse.com.au  Fact Sheet for Healthcare Providers: IncredibleEmployment.be  This test is not yet approved or cleared by the [place] FDA  and has been authorized for detection and/or diagnosis of SARS-CoV-2 by FDA under an Emergency Use Authorization (EUA). This EUA will remain in effect (meaning this test can be used) for the duration of the COVID-19 declaration under Section 564(b)(1) of the Act, 21 U.S.C. section 360bbb-3(b)(1), unless the authorization is terminated or revoked.  Performed at [facility], [place] [address], [place], [place] [address]   Blood Culture (routine x 2)     Status: None (Preliminary result)   Collection Time: 08/03/21  8:00 PM   Specimen: BLOOD  Result Value Ref Range Status   Specimen Description BLOOD SITE NOT SPECIFIED  Final   Special Requests   Final    BOTTLES DRAWN AEROBIC AND ANAEROBIC Blood Culture adequate volume   Culture   Final    NO GROWTH 4 DAYS Performed at [facility], [place] [address], [place], [place] [address]    Report Status PENDING  Incomplete  Blood Culture (routine x 2)     Status: None (Preliminary result)   Collection Time: 08/03/21  8:00 PM   Specimen: BLOOD  Result Value Ref Range Status   Specimen Description BLOOD SITE NOT SPECIFIED  Final   Special Requests   Final    BOTTLES DRAWN AEROBIC ONLY Blood Culture adequate volume   Culture   Final    NO GROWTH 4 DAYS Performed at [facility], [place] [address] [place], [place] [address]    Report Status PENDING  Incomplete     Labs: BNP (last 3 results) No results for input(s): BNP in the last 8760 hours. Basic Metabolic Panel: Recent Labs  Lab 08/03/21 2000 08/04/21 0347 08/06/21 0408  NA 133* 132* 138  K  3.8 3.6 3.9  CL 99 98 104  CO2 21* 23 25  GLUCOSE 109* 143* 97  BUN 11 12 10   CREATININE 1.18 1.01 0.62  CALCIUM 9.0 8.5* 8.6*    Liver Function Tests: Recent Labs  Lab 08/03/21 2000  AST 15  ALT 12  ALKPHOS 73  BILITOT 1.0  PROT 7.8  ALBUMIN 3.3*    No results for input(s): LIPASE, AMYLASE in the last 168 hours. No results for input(s): AMMONIA in the last 168  hours. CBC: Recent Labs  Lab 08/03/21 2000 08/04/21 0347 08/06/21 0408  WBC 13.6* 18.2* 10.6*  NEUTROABS 10.7*  --   --   HGB 13.0 11.2* 11.5*  HCT 39.6 33.1* 34.6*  MCV 90.4 89.5 88.7  PLT 426* 338 325    Cardiac Enzymes: No results for input(s): CKTOTAL, CKMB, CKMBINDEX, TROPONINI in the last 168 hours. BNP: Invalid input(s): POCBNP CBG: No results for input(s): GLUCAP in the last 168 hours. D-Dimer No results for input(s): DDIMER in the last 72 hours. Hgb A1c No results for input(s): HGBA1C in the last 72 hours. Lipid Profile No results for input(s): CHOL, HDL, LDLCALC, TRIG, CHOLHDL, LDLDIRECT in the last 72 hours. Thyroid function studies No results for input(s): TSH, T4TOTAL, T3FREE, THYROIDAB in the last 72 hours.  Invalid input(s): FREET3 Anemia work up No results for input(s): VITAMINB12, FOLATE, FERRITIN, TIBC, IRON, RETICCTPCT in the last 72 hours. Urinalysis    Component Value Date/Time   COLORURINE AMBER (A) 08/03/2021 1945   APPEARANCEUR CLOUDY (A) 08/03/2021 1945   LABSPEC 1.018 08/03/2021 1945   PHURINE 6.0 08/03/2021 1945   GLUCOSEU NEGATIVE 08/03/2021 1945   HGBUR MODERATE (A) 08/03/2021 1945   BILIRUBINUR NEGATIVE 08/03/2021 1945   KETONESUR 80 (A) 08/03/2021 1945   PROTEINUR 100 (A) 08/03/2021 1945   NITRITE NEGATIVE 08/03/2021 1945   LEUKOCYTESUR LARGE (A) 08/03/2021 1945   Sepsis Labs Invalid input(s): PROCALCITONIN,  WBC,  LACTICIDVEN Microbiology Recent Results (from the past 240 hour(s))  Urine Culture     Status: Abnormal   Collection Time: 08/03/21  7:45 PM   Specimen: In/Out Cath Urine  Result Value Ref Range Status   Specimen Description IN/OUT CATH URINE  Final   Special Requests   Final    NONE Performed at [facility], [place] [address] [place], [place] [address]    Culture >=100,000 COLONIES/mL ENTEROBACTER AEROGENES (A)  Final   Report Status 08/06/2021 FINAL  Final   Organism ID, Bacteria ENTEROBACTER AEROGENES (A)   Final      Susceptibility   Enterobacter aerogenes - MIC*    CEFAZOLIN >=64 RESISTANT Resistant     CEFEPIME <=0.12 SENSITIVE Sensitive     CEFTRIAXONE 8 RESISTANT Resistant     CIPROFLOXACIN <=0.25 SENSITIVE Sensitive     GENTAMICIN <=1 SENSITIVE Sensitive     IMIPENEM 1 SENSITIVE Sensitive     NITROFURANTOIN 64 INTERMEDIATE Intermediate     TRIMETH/SULFA <=20 SENSITIVE Sensitive     PIP/TAZO >=128 RESISTANT Resistant     * >=100,000 COLONIES/mL ENTEROBACTER AEROGENES  Resp Panel by RT-PCR (Flu A&B, Covid) Nasopharyngeal Swab     Status: None   Collection Time: 08/03/21  7:56 PM   Specimen: Nasopharyngeal Swab; Nasopharyngeal(NP) swabs in vial transport medium  Result Value Ref Range Status   SARS Coronavirus 2 by RT PCR NEGATIVE NEGATIVE Final    Comment: (NOTE) SARS-CoV-2 target nucleic acids are NOT DETECTED.  The SARS-CoV-2 RNA is generally detectable in upper respiratory  specimens during the acute phase of infection. The lowest concentration of SARS-CoV-2 viral copies this assay can detect is 138 copies/mL. A negative result does not preclude SARS-Cov-2 infection and should not be used as the sole basis for treatment or other patient management decisions. A negative result may occur with  improper specimen collection/handling, submission of specimen other than nasopharyngeal swab, presence of viral mutation(s) within the areas targeted by this assay, and inadequate number of viral copies(<138 copies/mL). A negative result must be combined with clinical observations, patient history, and epidemiological information. The expected result is Negative.  Fact Sheet for Patients:  EntrepreneurPulse.com.au  Fact Sheet for Healthcare Providers:  IncredibleEmployment.be  This test is no t yet approved or cleared by the [place] FDA and  has been authorized for detection and/or diagnosis of SARS-CoV-2 by FDA under an Emergency Use  Authorization (EUA). This EUA will remain  in effect (meaning this test can be used) for the duration of the COVID-19 declaration under Section 564(b)(1) of the Act, 21 U.S.C.section 360bbb-3(b)(1), unless the authorization is terminated  or revoked sooner.       Influenza A by PCR NEGATIVE NEGATIVE Final   Influenza B by PCR NEGATIVE NEGATIVE Final    Comment: (NOTE) The Xpert Xpress SARS-CoV-2/FLU/RSV plus assay is intended as an aid in the diagnosis of influenza from Nasopharyngeal swab specimens and should not be used as a sole basis for treatment. Nasal washings and aspirates are unacceptable for Xpert Xpress SARS-CoV-2/FLU/RSV testing.  Fact Sheet for Patients: EntrepreneurPulse.com.au  Fact Sheet for Healthcare Providers: IncredibleEmployment.be  This test is not yet approved or cleared by the [place] FDA and has been authorized for detection and/or diagnosis of SARS-CoV-2 by FDA under an Emergency Use Authorization (EUA). This EUA will remain in effect (meaning this test can be used) for the duration of the COVID-19 declaration under Section 564(b)(1) of the Act, 21 U.S.C. section 360bbb-3(b)(1), unless the authorization is terminated or revoked.  Performed at [facility], [place] [address] [place], [place] [address]   Blood Culture (routine x 2)     Status: None (Preliminary result)   Collection Time: 08/03/21  8:00 PM   Specimen: BLOOD  Result Value Ref Range Status   Specimen Description BLOOD SITE NOT SPECIFIED  Final   Special Requests   Final    BOTTLES DRAWN AEROBIC AND ANAEROBIC Blood Culture adequate volume   Culture   Final    NO GROWTH 4 DAYS Performed at [facility], [place] [address] [place], [place] [address]    Report Status PENDING  Incomplete  Blood Culture (routine x 2)     Status: None (Preliminary result)   Collection Time: 08/03/21  8:00 PM   Specimen: BLOOD  Result Value Ref Range Status    Specimen Description BLOOD SITE NOT SPECIFIED  Final   Special Requests   Final    BOTTLES DRAWN AEROBIC ONLY Blood Culture adequate volume   Culture   Final    NO GROWTH 4 DAYS Performed at [facility], [place] [address] [place], [place] [address]    Report Status PENDING  Incomplete     Time coordinating discharge: 35 minutes  SIGNED:   Cordelia Poche, MD [facility] 08/07/2021, 8:30 AM

## 2021-08-07 NOTE — Progress Notes (Signed)
Urology Inpatient Progress Report     Intv/Subj: Patient had allergic reaction to Cipro yesterday.  Antibiotics were changed to Bactrim.  He is ready for discharge this morning.  He has right flank pain when he urinates but otherwise abdominal pain and nausea has resolved.  Principal Problem:   Sepsis (HCC) Active Problems:   Hydronephrosis of right kidney   Displacement of ureteral stent (HCC)   UTI (urinary tract infection)   Constipation   Allergic reaction  Current Facility-Administered Medications  Medication Dose Route Frequency Provider Last Rate Last Admin   acetaminophen (TYLENOL) tablet 650 mg  650 mg Oral Q4H PRN Eduard Clos, MD   650 mg at 08/04/21 0108   darifenacin (ENABLEX) 24 hr tablet 15 mg  15 mg Oral Daily Jannifer Hick, MD   15 mg at 08/07/21 0901   diphenhydrAMINE (BENADRYL) injection 12.5-25 mg  12.5-25 mg Intravenous Q6H PRN Eduard Clos, MD       Or   diphenhydrAMINE (BENADRYL) 12.5 MG/5ML elixir 12.5-25 mg  12.5-25 mg Oral Q6H PRN Eduard Clos, MD       docusate sodium (COLACE) capsule 100 mg  100 mg Oral BID Eduard Clos, MD   100 mg at 08/07/21 0901   feeding supplement (ENSURE ENLIVE / ENSURE PLUS) liquid 237 mL  237 mL Oral BID BM Eduard Clos, MD   237 mL at 08/07/21 0902   heparin injection 5,000 Units  5,000 Units Subcutaneous Q8H Eduard Clos, MD   5,000 Units at 08/07/21 0550   morphine 2 MG/ML injection 2-4 mg  2-4 mg Intravenous Q2H PRN Eduard Clos, MD       ondansetron [facility]) injection 4 mg  4 mg Intravenous Q4H PRN Eduard Clos, MD       oxyCODONE (Oxy IR/ROXICODONE) immediate release tablet 5 mg  5 mg Oral Q4H PRN Eduard Clos, MD   5 mg at 08/05/21 1435   polyethylene glycol (MIRALAX / GLYCOLAX) packet 17 g  17 g Oral Daily Narda Bonds, MD   17 g at 08/07/21 0902   senna (SENOKOT) tablet 8.6 mg  1 tablet Oral BID Eduard Clos, MD   8.6 mg at 08/07/21 0902    sulfamethoxazole-trimethoprim (BACTRIM DS) 800-160 MG per tablet 1 tablet  1 tablet Oral Q12H Narda Bonds, MD   1 tablet at 08/07/21 0901   tamsulosin (FLOMAX) capsule 0.4 mg  0.4 mg Oral Daily Eduard Clos, MD   0.4 mg at 08/07/21 0901     Objective: Vital: Vitals:   08/06/21 1449 08/06/21 2035 08/06/21 2200 08/07/21 0458  BP: 105/80 108/73 110/75 101/74  Pulse: 86 (!) 108 (!) 102 75  Resp:  14 16 16   Temp: 97.8 F (36.6 C) 98.5 F (36.9 C) 98.4 F (36.9 C) 97.8 F (36.6 C)  TempSrc: Oral Oral Oral Oral  SpO2: 98% 97% 98% 96%  Weight:      Height:       I/Os: I/O last 3 completed shifts: In: 440 [P.O.:340; IV Piggyback:100] Out: 3450 [Urine:3450]  Physical Exam:  General: Patient is in no apparent distress Lungs: Normal respiratory effort, chest expands symmetrically. GI: The abdomen is soft and nontender  Ext: lower extremities symmetric  Lab Results: Recent Labs    08/06/21 0408  WBC 10.6*  HGB 11.5*  HCT 34.6*   Recent Labs    08/06/21 0408  NA 138  K 3.9  CL 104  CO2 25  GLUCOSE 97  BUN 10  CREATININE 0.62  CALCIUM 8.6*   No results for input(s): LABPT, INR in the last 72 hours. No results for input(s): LABURIN in the last 72 hours. Results for orders placed or performed during the hospital encounter of 08/03/21  Urine Culture     Status: Abnormal   Collection Time: 08/03/21  7:45 PM   Specimen: In/Out Cath Urine  Result Value Ref Range Status   Specimen Description IN/OUT CATH URINE  Final   Special Requests   Final    NONE Performed at [facility], [address], [place], [place] [address]    Culture >=100,000 COLONIES/mL ENTEROBACTER AEROGENES (A)  Final   Report Status 08/06/2021 FINAL  Final   Organism ID, Bacteria ENTEROBACTER AEROGENES (A)  Final      Susceptibility   Enterobacter aerogenes - MIC*    CEFAZOLIN >=64 RESISTANT Resistant     CEFEPIME <=0.12 SENSITIVE Sensitive     CEFTRIAXONE 8 RESISTANT Resistant      CIPROFLOXACIN <=0.25 SENSITIVE Sensitive     GENTAMICIN <=1 SENSITIVE Sensitive     IMIPENEM 1 SENSITIVE Sensitive     NITROFURANTOIN 64 INTERMEDIATE Intermediate     TRIMETH/SULFA <=20 SENSITIVE Sensitive     PIP/TAZO >=128 RESISTANT Resistant     * >=100,000 COLONIES/mL ENTEROBACTER AEROGENES  Resp Panel by RT-PCR (Flu A&B, Covid) Nasopharyngeal Swab     Status: None   Collection Time: 08/03/21  7:56 PM   Specimen: Nasopharyngeal Swab; Nasopharyngeal(NP) swabs in vial transport medium  Result Value Ref Range Status   SARS Coronavirus 2 by RT PCR NEGATIVE NEGATIVE Final    Comment: (NOTE) SARS-CoV-2 target nucleic acids are NOT DETECTED.  The SARS-CoV-2 RNA is generally detectable in upper respiratory specimens during the acute phase of infection. The lowest concentration of SARS-CoV-2 viral copies this assay can detect is 138 copies/mL. A negative result does not preclude SARS-Cov-2 infection and should not be used as the sole basis for treatment or other patient management decisions. A negative result may occur with  improper specimen collection/handling, submission of specimen other than nasopharyngeal swab, presence of viral mutation(s) within the areas targeted by this assay, and inadequate number of viral copies(<138 copies/mL). A negative result must be combined with clinical observations, patient history, and epidemiological information. The expected result is Negative.  Fact Sheet for Patients:  EntrepreneurPulse.com.au  Fact Sheet for Healthcare Providers:  IncredibleEmployment.be  This test is no t yet approved or cleared by the [place] FDA and  has been authorized for detection and/or diagnosis of SARS-CoV-2 by FDA under an Emergency Use Authorization (EUA). This EUA will remain  in effect (meaning this test can be used) for the duration of the COVID-19 declaration under Section 564(b)(1) of the Act, 21 U.S.C.section  360bbb-3(b)(1), unless the authorization is terminated  or revoked sooner.       Influenza A by PCR NEGATIVE NEGATIVE Final   Influenza B by PCR NEGATIVE NEGATIVE Final    Comment: (NOTE) The Xpert Xpress SARS-CoV-2/FLU/RSV plus assay is intended as an aid in the diagnosis of influenza from Nasopharyngeal swab specimens and should not be used as a sole basis for treatment. Nasal washings and aspirates are unacceptable for Xpert Xpress SARS-CoV-2/FLU/RSV testing.  Fact Sheet for Patients: EntrepreneurPulse.com.au  Fact Sheet for Healthcare Providers: IncredibleEmployment.be  This test is not yet approved or cleared by the [place] FDA and has been authorized for detection and/or diagnosis of SARS-CoV-2  by FDA under an Emergency Use Authorization (EUA). This EUA will remain in effect (meaning this test can be used) for the duration of the COVID-19 declaration under Section 564(b)(1) of the Act, 21 U.S.C. section 360bbb-3(b)(1), unless the authorization is terminated or revoked.  Performed at [facility], [place] [address] [place], [place] [address]   Blood Culture (routine x 2)     Status: None (Preliminary result)   Collection Time: 08/03/21  8:00 PM   Specimen: BLOOD  Result Value Ref Range Status   Specimen Description BLOOD SITE NOT SPECIFIED  Final   Special Requests   Final    BOTTLES DRAWN AEROBIC AND ANAEROBIC Blood Culture adequate volume   Culture   Final    NO GROWTH 4 DAYS Performed at [facility], [place] [address] [place], [place] [address]    Report Status PENDING  Incomplete  Blood Culture (routine x 2)     Status: None (Preliminary result)   Collection Time: 08/03/21  8:00 PM   Specimen: BLOOD  Result Value Ref Range Status   Specimen Description BLOOD SITE NOT SPECIFIED  Final   Special Requests   Final    BOTTLES DRAWN AEROBIC ONLY Blood Culture adequate volume   Culture   Final    NO GROWTH 4  DAYS Performed at [facility], [place] [address], [place], [place] [address]    Report Status PENDING  Incomplete    Studies/Results: CT Angio Abd/Pel w/ and/or w/o  Result Date: 08/05/2021 CLINICAL DATA:  Right UPJ obstruction. Evaluate for crossing vessel to explain the etiology of the patient's obstruction. EXAM: CTA ABDOMEN AND PELVIS WITHOUT AND WITH CONTRAST TECHNIQUE: Multidetector CT imaging of the abdomen and pelvis was performed using the standard protocol during bolus administration of intravenous contrast. Multiplanar reconstructed images and MIPs were obtained and reviewed to evaluate the vascular anatomy. CONTRAST:  172mL OMNIPAQUE IOHEXOL 350 MG/ML SOLN COMPARISON:  CT abdomen pelvis-08/03/2021; 06/18/2021 Intraoperative images during right-sided double-J ureteral stent exchange-08/04/2021 FINDINGS: VASCULAR Aorta: There is no significant atherosclerotic plaque within the normal caliber abdominal aorta which is widely patent without hemodynamically significant narrowing. No evidence of dissection, vessel irregularity or perivascular stranding. Celiac: Widely patent without a hemodynamically significant narrowing. SMA: Widely patent without hemodynamically significant narrowing. A replaced right hepatic artery is noted to arise from the proximal SMA. The distal tributaries of the SMA appear widely patent without discrete intraluminal filling defect to suggest distal embolism. Renals: Solitary duplicated bilaterally; there are small accessory renal arteries seen bilaterally both of which supply the superior poles of the right and left kidneys. All renal arteries are widely patent without a hemodynamically significant narrowing. No vessel irregularity to suggest FMD. No evidence of aneurysm or AV malformation. Note, the location of the suspected UPJ stenosis on initial noncontrast CT scan of the abdomen pelvis performed 06/18/2021 is inferior to the right renal arteries as well as the right  renal vein. IMA: Widely patent without a hemodynamically significant narrowing. There is early collateral supply from the SMA. Inflow: There is a very minimal amount of calcified atherosclerotic plaque involving the distal aspect of the right common iliac artery, not resulting in hemodynamically significant stenosis. The bilateral internal and external iliac arteries are of normal caliber and widely patent without hemodynamically significant narrowing. Proximal Outflow: The bilateral common femoral and imaged portions of the bilateral deep and superficial femoral arteries are of normal caliber and widely patent without a hemodynamically significant narrowing. Veins: The  IVC and pelvic venous systems appear widely patent. Note, the bilateral renal veins are widely patent and of conventional configuration. Review of the MIP images confirms the above findings. _________________________________________________________ NON-VASCULAR Lower chest: Limited visualization of the lower thorax demonstrates minimal dependent subpleural ground-glass atelectasis. No discrete focal airspace opacities. Normal heart size.  No pericardial effusion. Hepatobiliary: Normal hepatic contour. There is a punctate subcentimeter hypoattenuating lesion within the dome of the left lobe of the liver (image 20, series 4), which is too small to accurately characterize though favored to represent a hepatic cyst. No discrete worrisome hyperenhancing hepatic lesions. Normal appearance of the gallbladder given degree distention. No radiopaque gallstones. No intra extrahepatic bili duct dilatation. No ascites. Pancreas: Normal appearance of the pancreas. Spleen: Normal appearance of the spleen. Note is made of a tiny splenule about the anterior inferior aspect of the spleen. Adrenals/Urinary Tract: There is mild slightly delayed enhancement of the right kidney in comparison to the left with associated mild striated nephrogram appearance (representative  axial images 31, 37 and 44, series 11; coronal images 53, 62 and 67, series 14). No definite evidence of nephrolithiasis on this postcontrast examination. No discrete worrisome lesions. Interval exchange/repositioning of right-sided double-J ureteral stent with superior coil now better positioned within the superior aspect of the right renal collecting system and inferior coil within the urinary bladder. This finding is associated with near complete resolution of previously noted mild residual right-sided pelvicaliectasis. Note is made of a tiny focus of intraluminal air about the right renal pelvis at location of previously suboptimally positioned double-J stent (image 64, series 4), the sequela of recent stent exchange and repositioning as demonstrated on intraoperative radiographs performed 08/04/2021. No evidence of left-sided urinary obstruction. There is a tiny focus of air within the urinary bladder, likely the sequela of recent right-sided double-J ureteral stent exchange. Otherwise, normal appearance of the urinary bladder. Stomach/Bowel: Large colonic stool burden, particularly within the sigmoid colon and rectal vault, without evidence of enteric obstruction. Normal appearance of the terminal ileum and the retrocecal appendix. No evidence of hiatal hernia. No discrete areas of bowel wall thickening. No pneumoperitoneum, pneumatosis or portal venous gas. Lymphatic: Scattered retroperitoneal lymph nodes are numerous though individually not enlarged by size criteria with index aortocaval lymph node measuring 0.6 cm in greatest oblique short axis diameter (image 47, series 11) and index retrocaval lymph node measuring 0.7 cm in greatest short axis diameter (image 36, series 11), similar to the 06/2021 examination and presumably reactive in etiology. No bulky retroperitoneal, mesenteric, pelvic or inguinal lymphadenopathy Reproductive: Normal appearance the prostate gland. No free fluid the pelvic cul-de-sac.  Other: Regional soft tissues appear normal. Musculoskeletal: No acute or aggressive osseous abnormalities. 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 Stigmata of dish within the lower thoracic spine. Mild degenerative change of the bilateral hips with joint space loss, subchondral sclerosis and osteophytosis. IMPRESSION: 1. Slightly delayed enhancement of the right kidney with associated striated nephrogram appearance as could be seen in the setting of pyelonephritis. No definable/drainable renal abscess. 2. Interval repositioning/exchange of right-sided double-J ureteral stent with superior coil now better positioned within the superior aspect of the right renal collecting system. No evidence of residual right-sided pelvicaliectasis or ureterectasis. 3. No evidence of a vascular cause to suggest the etiology of the patient's right UPJ stenosis/obstruction. Specifically, the right renal arteries and the right renal vein are located superior to the transition of the UPJ stenosis seen  on initial noncontrast CT scan performed 06/18/2021. 4. Large colonic stool burden, particularly within the distal sigmoid colon and rectal vault, without evidence of enteric obstruction. 5. Incidentally noted bilateral L5 pars defects with associated grade 1 anterolisthesis of L5 upon S1. Electronically Signed   By: Sandi Mariscal M.D.   On: 08/05/2021 14:46    Assessment: POD4 s/p right ureteral stent exchange  Plan: -no crossing vessel on CTA -continue antibiotics for 2 weeks -Patient to follow-up in the office for long-term management of hydronephrosis  Jacalyn Lefevre, MD Urology 08/07/2021, 9:06 AM

## 2021-08-07 NOTE — Plan of Care (Signed)
  Problem: Clinical Measurements: Goal: Respiratory complications will improve Outcome: Progressing Goal: Cardiovascular complication will be avoided Outcome: Progressing   Problem: Activity: Goal: Risk for activity intolerance will decrease Outcome: Progressing   Problem: Coping: Goal: Level of anxiety will decrease Outcome: Progressing   

## 2021-08-08 ENCOUNTER — Other Ambulatory Visit: Payer: Self-pay

## 2021-08-08 LAB — CULTURE, BLOOD (ROUTINE X 2)
Culture: NO GROWTH
Culture: NO GROWTH
Special Requests: ADEQUATE
Special Requests: ADEQUATE

## 2021-08-13 NOTE — Anesthesia Postprocedure Evaluation (Signed)
Anesthesia Post Note  Patient: Jordan Patrick  Procedure(s) Performed: CYSTOSCOPY WITH RETROGRADE PYELOGRAM, DIAGNOSTIC URETEROSCOPY AND STENT EXCHANGE (Right: Ureter)     Patient location during evaluation: PACU Anesthesia Type: General Level of consciousness: awake and alert Pain management: pain level controlled Vital Signs Assessment: post-procedure vital signs reviewed and stable Respiratory status: spontaneous breathing, nonlabored ventilation, respiratory function stable and patient connected to nasal cannula oxygen Cardiovascular status: blood pressure returned to baseline and stable Postop Assessment: no apparent nausea or vomiting Anesthetic complications: no   No notable events documented.  Last Vitals:  Vitals:   08/07/21 0458 08/07/21 1134  BP: 101/74 108/71  Pulse: 75 80  Resp: 16 17  Temp: 36.6 C 36.4 C  SpO2: 96% 97%    Last Pain:  Vitals:   08/07/21 1134  TempSrc: Oral  PainSc:                  [name]

## 2021-09-22 ENCOUNTER — Other Ambulatory Visit: Payer: Self-pay

## 2021-09-22 ENCOUNTER — Encounter (HOSPITAL_COMMUNITY): Payer: Self-pay | Admitting: Emergency Medicine

## 2021-09-22 ENCOUNTER — Emergency Department (HOSPITAL_COMMUNITY): Payer: No Typology Code available for payment source

## 2021-09-22 ENCOUNTER — Emergency Department (HOSPITAL_COMMUNITY)
Admission: EM | Admit: 2021-09-22 | Discharge: 2021-09-22 | Disposition: A | Discharge status: Home / Self Care | Payer: No Typology Code available for payment source | Attending: Emergency Medicine | Admitting: Emergency Medicine

## 2021-09-22 DIAGNOSIS — Y92410 Unspecified street and highway as the place of occurrence of the external cause: Secondary | ICD-10-CM | POA: Insufficient documentation

## 2021-09-22 DIAGNOSIS — M546 Pain in thoracic spine: Secondary | ICD-10-CM | POA: Diagnosis not present

## 2021-09-22 DIAGNOSIS — M25561 Pain in right knee: Secondary | ICD-10-CM | POA: Diagnosis not present

## 2021-09-22 DIAGNOSIS — M545 Low back pain, unspecified: Secondary | ICD-10-CM | POA: Diagnosis not present

## 2021-09-22 DIAGNOSIS — M542 Cervicalgia: Secondary | ICD-10-CM

## 2021-09-22 DIAGNOSIS — R52 Pain, unspecified: Secondary | ICD-10-CM

## 2021-09-22 DIAGNOSIS — R110 Nausea: Secondary | ICD-10-CM | POA: Diagnosis not present

## 2021-09-22 DIAGNOSIS — G8929 Other chronic pain: Secondary | ICD-10-CM | POA: Diagnosis not present

## 2021-09-22 DIAGNOSIS — M549 Dorsalgia, unspecified: Secondary | ICD-10-CM

## 2021-09-22 DIAGNOSIS — R1084 Generalized abdominal pain: Secondary | ICD-10-CM | POA: Diagnosis not present

## 2021-09-22 DIAGNOSIS — M25512 Pain in left shoulder: Secondary | ICD-10-CM

## 2021-09-22 DIAGNOSIS — M5489 Other dorsalgia: Secondary | ICD-10-CM

## 2021-09-22 DIAGNOSIS — R079 Chest pain, unspecified: Secondary | ICD-10-CM | POA: Diagnosis not present

## 2021-09-22 LAB — COMPREHENSIVE METABOLIC PANEL
ALT: 19 U/L (ref 0–44)
AST: 17 U/L (ref 15–41)
Albumin: 4.3 g/dL (ref 3.5–5.0)
Alkaline Phosphatase: 60 U/L (ref 38–126)
Anion gap: 5 (ref 5–15)
BUN: 17 mg/dL (ref 6–20)
CO2: 23 mmol/L (ref 22–32)
Calcium: 8.9 mg/dL (ref 8.9–10.3)
Chloride: 110 mmol/L (ref 98–111)
Creatinine, Ser: 0.9 mg/dL (ref 0.61–1.24)
GFR, Estimated: >60 mL/min (ref >60)
Glucose, Bld: 106 mg/dL — ABNORMAL HIGH (ref 70–99)
Potassium: 4 mmol/L (ref 3.5–5.1)
Sodium: 138 mmol/L (ref 135–145)
Total Bilirubin: 0.9 mg/dL (ref 0.3–1.2)
Total Protein: 7.4 g/dL (ref 6.5–8.1)

## 2021-09-22 LAB — CBC WITH DIFFERENTIAL/PLATELET
Abs Immature Granulocytes: 0.02 10*3/uL (ref 0.00–0.07)
Basophils Absolute: 0 10*3/uL (ref 0.0–0.1)
Basophils Relative: 1 %
Eosinophils Absolute: 0.1 10*3/uL (ref 0.0–0.5)
Eosinophils Relative: 2 %
HCT: 44.9 % (ref 39.0–52.0)
Hemoglobin: 15.2 g/dL (ref 13.0–17.0)
Immature Granulocytes: 0 %
Lymphocytes Relative: 29 %
Lymphs Abs: 1.9 10*3/uL (ref 0.7–4.0)
MCH: 30.5 pg (ref 26.0–34.0)
MCHC: 33.9 g/dL (ref 30.0–36.0)
MCV: 90 fL (ref 80.0–100.0)
Monocytes Absolute: 0.5 10*3/uL (ref 0.1–1.0)
Monocytes Relative: 8 %
Neutro Abs: 3.9 10*3/uL (ref 1.7–7.7)
Neutrophils Relative %: 60 %
Platelets: 308 10*3/uL (ref 150–400)
RBC: 4.99 MIL/uL (ref 4.22–5.81)
RDW: 15 % (ref 11.5–15.5)
WBC: 6.6 10*3/uL (ref 4.0–10.5)
nRBC: 0 % (ref 0.0–0.2)

## 2021-09-22 MED ORDER — LIDOCAINE 4 % EX PTCH
1.0000 | MEDICATED_PATCH | Freq: Two times a day (BID) | CUTANEOUS | 0 refills | Status: AC | PRN
Start: 2021-09-22 — End: ?

## 2021-09-22 MED ORDER — METHOCARBAMOL 500 MG PO TABS: 500.0000 mg | ORAL_TABLET | Freq: Three times a day (TID) | ORAL | 0 refills | Status: AC | PRN

## 2021-09-22 MED ORDER — METHOCARBAMOL 500 MG PO TABS
500.0000 mg | ORAL_TABLET | Freq: Once | ORAL | Status: AC
  Administered 2021-09-22: 500 mg via ORAL
  Filled 2021-09-22: qty 1

## 2021-09-22 MED ORDER — IOHEXOL 300 MG/ML  SOLN
100.0000 mL | Freq: Once | INTRAMUSCULAR | Status: AC | PRN
  Administered 2021-09-22: 100 mL via INTRAVENOUS

## 2021-09-22 MED ORDER — SODIUM CHLORIDE (PF) 0.9 % IJ SOLN
INTRAMUSCULAR | Status: AC
  Filled 2021-09-22: qty 50

## 2021-09-22 MED ORDER — OXYCODONE-ACETAMINOPHEN 5-325 MG PO TABS
1.0000 | ORAL_TABLET | Freq: Once | ORAL | Status: AC
Start: 2021-09-22 — End: 2021-09-22
  Administered 2021-09-22: 1 via ORAL
  Filled 2021-09-22: qty 1
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
## 2021-10-17 ENCOUNTER — Other Ambulatory Visit (HOSPITAL_COMMUNITY): Payer: Self-pay | Admitting: Urology

## 2021-10-17 DIAGNOSIS — N1330 Unspecified hydronephrosis: Secondary | ICD-10-CM

## 2021-10-24 ENCOUNTER — Other Ambulatory Visit: Payer: Self-pay

## 2021-10-24 ENCOUNTER — Ambulatory Visit (HOSPITAL_COMMUNITY)
Admission: RE | Admit: 2021-10-24 | Discharge: 2021-10-24 | Disposition: A | Discharge status: Home / Self Care | Payer: Self-pay | Source: Ambulatory Visit | Attending: Urology | Admitting: Urology

## 2021-10-24 DIAGNOSIS — N1330 Unspecified hydronephrosis: Secondary | ICD-10-CM | POA: Insufficient documentation

## 2021-10-24 MED ORDER — FUROSEMIDE 10 MG/ML IJ SOLN: 37.0000 mg | Freq: Once | INTRAMUSCULAR | Status: AC

## 2021-10-24 MED ORDER — TECHNETIUM TC 99M MERTIATIDE
5.1000 | Freq: Once | INTRAVENOUS | Status: AC | PRN
  Administered 2021-10-24: 5.1 via INTRAVENOUS

## 2021-10-24 MED ORDER — FUROSEMIDE 10 MG/ML IJ SOLN
INTRAMUSCULAR | Status: AC
  Administered 2021-10-24: 37 mg via INTRAVENOUS
  Filled 2021-10-24: qty 4
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
## 2023-01-22 ENCOUNTER — Other Ambulatory Visit: Payer: Self-pay

## 2023-01-22 ENCOUNTER — Encounter (HOSPITAL_COMMUNITY): Payer: Self-pay

## 2023-01-22 ENCOUNTER — Emergency Department (HOSPITAL_COMMUNITY)
Admission: EM | Admit: 2023-01-22 | Discharge: 2023-01-22 | Disposition: A | Discharge status: Home / Self Care | Payer: Self-pay | Attending: Emergency Medicine | Admitting: Emergency Medicine

## 2023-01-22 ENCOUNTER — Emergency Department (HOSPITAL_COMMUNITY): Payer: Self-pay

## 2023-01-22 DIAGNOSIS — R000 Tachycardia, unspecified: Secondary | ICD-10-CM | POA: Insufficient documentation

## 2023-01-22 DIAGNOSIS — K529 Noninfective gastroenteritis and colitis, unspecified: Secondary | ICD-10-CM | POA: Insufficient documentation

## 2023-01-22 DIAGNOSIS — Z1152 Encounter for screening for COVID-19: Secondary | ICD-10-CM | POA: Insufficient documentation

## 2023-01-22 LAB — COMPREHENSIVE METABOLIC PANEL
ALT: 28 U/L (ref 0–44)
AST: 23 U/L (ref 15–41)
Albumin: 4.3 g/dL (ref 3.5–5.0)
Alkaline Phosphatase: 74 U/L (ref 38–126)
Anion gap: 12 (ref 5–15)
BUN: 13 mg/dL (ref 6–20)
CO2: 22 mmol/L (ref 22–32)
Calcium: 9.4 mg/dL (ref 8.9–10.3)
Chloride: 104 mmol/L (ref 98–111)
Creatinine, Ser: 1.1 mg/dL (ref 0.61–1.24)
GFR, Estimated: >60 mL/min (ref >60)
Glucose, Bld: 121 mg/dL — ABNORMAL HIGH (ref 70–99)
Potassium: 3.6 mmol/L (ref 3.5–5.1)
Sodium: 138 mmol/L (ref 135–145)
Total Bilirubin: 0.7 mg/dL (ref 0.3–1.2)
Total Protein: 8 g/dL (ref 6.5–8.1)

## 2023-01-22 LAB — URINALYSIS, ROUTINE W REFLEX MICROSCOPIC
Bacteria, UA: NONE SEEN
Bilirubin Urine: NEGATIVE
Glucose, UA: NEGATIVE mg/dL
Hgb urine dipstick: NEGATIVE
Ketones, ur: 20 mg/dL — AB
Leukocytes,Ua: NEGATIVE
Nitrite: NEGATIVE
Protein, ur: 30 mg/dL — AB
Specific Gravity, Urine: 1.031 — ABNORMAL HIGH (ref 1.005–1.030)
pH: 5 (ref 5.0–8.0)

## 2023-01-22 LAB — CBC
HCT: 49.5 % (ref 39.0–52.0)
Hemoglobin: 16.5 g/dL (ref 13.0–17.0)
MCH: 30.4 pg (ref 26.0–34.0)
MCHC: 33.3 g/dL (ref 30.0–36.0)
MCV: 91.3 fL (ref 80.0–100.0)
Platelets: 311 10*3/uL (ref 150–400)
RBC: 5.42 MIL/uL (ref 4.22–5.81)
RDW: 13.3 % (ref 11.5–15.5)
WBC: 10.8 10*3/uL — ABNORMAL HIGH (ref 4.0–10.5)
nRBC: 0 % (ref 0.0–0.2)

## 2023-01-22 LAB — LIPASE, BLOOD: Lipase: 39 U/L (ref 11–51)

## 2023-01-22 LAB — SARS CORONAVIRUS 2 BY RT PCR: SARS Coronavirus 2 by RT PCR: NEGATIVE

## 2023-01-22 MED ORDER — ONDANSETRON 4 MG PO TBDP: 4.0000 mg | ORAL_TABLET | Freq: Three times a day (TID) | ORAL | 0 refills | Status: DC | PRN

## 2023-01-22 MED ORDER — ONDANSETRON HCL 4 MG/2ML IJ SOLN
4.0000 mg | Freq: Once | INTRAMUSCULAR | Status: AC
  Administered 2023-01-22: 4 mg via INTRAVENOUS
  Filled 2023-01-22: qty 2

## 2023-01-22 MED ORDER — LACTATED RINGERS IV BOLUS
2000.0000 mL | Freq: Once | INTRAVENOUS | Status: AC
  Administered 2023-01-22: 2000 mL via INTRAVENOUS

## 2023-01-22 MED ORDER — KETOROLAC TROMETHAMINE 15 MG/ML IJ SOLN
15.0000 mg | Freq: Once | INTRAMUSCULAR | Status: AC
  Administered 2023-01-22: 15 mg via INTRAVENOUS
  Filled 2023-01-22: qty 1

## 2023-01-22 NOTE — ED Provider Notes (Signed)
[place] EMERGENCY DEPARTMENT AT [facility] Provider Note   CSN: [id number] Arrival date & time: 01/22/23  1034     History  Chief Complaint  Patient presents with   Nausea   Emesis   Diarrhea   Generalized Body Aches    Jordan Patrick is a 52 y.o. male.  52 year old male presents today for evaluation of vomiting, diarrhea, as well as some abdominal discomfort.  Symptoms started yesterday.  Has not been able to keep anything down since then.  Denies fever.  Denies other complaints.  No hematemesis, or blood in stool.  The history is provided by the patient. No language interpreter was used.       Home Medications Prior to Admission medications   Medication Sig Start Date End Date Taking? Authorizing Provider  feeding supplement (ENSURE ENLIVE / ENSURE PLUS) LIQD Take 237 mLs by mouth 2 (two) times daily between meals. 08/06/21   Narda Bonds, MD  Lidocaine (HM LIDOCAINE PATCH) 4 % PTCH Apply 1 patch topically every 12 (twelve) hours as needed. 09/22/21   Haskel Schroeder, PA-C  methocarbamol (ROBAXIN) 500 MG tablet Take 1 tablet (500 mg total) by mouth every 8 (eight) hours as needed for muscle spasms. 09/22/21   Haskel Schroeder, PA-C  polyethylene glycol (MIRALAX / GLYCOLAX) 17 g packet Take 17 g by mouth daily. 08/06/21   Narda Bonds, MD  senna-docusate (SENOKOT-S) 8.6-50 MG tablet Take 1 tablet by mouth at bedtime as needed for mild constipation. 06/20/21   Meredeth Ide, MD  tamsulosin (FLOMAX) 0.4 MG CAPS capsule Take 1 capsule (0.4 mg total) by mouth daily. 08/06/21   Narda Bonds, MD      Allergies    Cipro [ciprofloxacin hcl]    Review of Systems   Review of Systems  Constitutional:  Negative for chills and fever.  Gastrointestinal:  Positive for abdominal pain, diarrhea and vomiting.  Genitourinary:  Negative for dysuria and flank pain.  Neurological:  Negative for light-headedness.  All other systems reviewed and are  negative.   Physical Exam Updated Vital Signs BP 108/80   Pulse (!) 106   Temp 99.8 F (37.7 C) (Oral)   Resp 20   SpO2 99%  Physical Exam Vitals and nursing note reviewed.  Constitutional:      General: He is not in acute distress.    Appearance: Normal appearance. He is not ill-appearing.  HENT:     Head: Normocephalic and atraumatic.     Nose: Nose normal.  Eyes:     General: No scleral icterus.    Extraocular Movements: Extraocular movements intact.     Conjunctiva/sclera: Conjunctivae normal.  Cardiovascular:     Rate and Rhythm: Normal rate and regular rhythm.     Pulses: Normal pulses.  Pulmonary:     Effort: Pulmonary effort is normal. No respiratory distress.     Breath sounds: Normal breath sounds. No wheezing or rales.  Abdominal:     General: There is no distension.     Palpations: Abdomen is soft.     Tenderness: There is abdominal tenderness (Diffuse). There is no right CVA tenderness, left CVA tenderness or guarding.  Musculoskeletal:        General: Normal range of motion.     Cervical back: Normal range of motion.  Skin:    General: Skin is warm and dry.  Neurological:     General: No focal deficit present.     Mental Status:  He is alert. Mental status is at baseline.     ED Results / Procedures / Treatments   Labs (all labs ordered are listed, but only abnormal results are displayed) Labs Reviewed  CBC - Abnormal; Notable for the following components:      Result Value   WBC 10.8 (*)    All other components within normal limits  URINALYSIS, ROUTINE W REFLEX MICROSCOPIC - Abnormal; Notable for the following components:   Color, Urine AMBER (*)    APPearance HAZY (*)    Specific Gravity, Urine 1.031 (*)    Ketones, ur 20 (*)    Protein, ur 30 (*)    All other components within normal limits  SARS CORONAVIRUS 2 BY RT PCR  LIPASE, BLOOD  COMPREHENSIVE METABOLIC PANEL    EKG None  Radiology No results found.  Procedures Procedures     Medications Ordered in ED Medications  ondansetron (ZOFRAN) injection 4 mg (has no administration in time range)  lactated ringers bolus 2,000 mL (has no administration in time range)  ketorolac (TORADOL) 15 MG/ML injection 15 mg (has no administration in time range)    ED Course/ Medical Decision Making/ A&P                             Medical Decision Making Amount and/or Complexity of Data Reviewed Labs: ordered. Radiology: ordered.  Risk Prescription drug management.   52 year old male presents with symptoms consistent with gastroenteritis.  Symptoms started yesterday.  He has not been able to keep anything down.  He is hemodynamically stable.  Mild tachycardia likely due to dehydration.  Will obtain labs.  Will provide Toradol, Zofran, and 2 L fluid bolus.  Will reevaluate.  Patient states he had a stone in the past.  Will obtain CT renal stone study.   CT renal stone study without any acute findings.  Patient remains with improved symptoms.  He is appropriate for discharge.  Discharged in stable condition.  Return precaution discussed.  Patient voices understanding and is in agreement with plan.   Final Clinical Impression(s) / ED Diagnoses Final diagnoses:  Gastroenteritis    Rx / DC Orders ED Discharge Orders          Ordered    ondansetron (ZOFRAN-ODT) 4 MG disintegrating tablet  Every 8 hours PRN        01/22/23 1501              Marita Kansas, PA-C 01/22/23 1504    Ernie Avena, MD 01/22/23 1521

## 2023-01-22 NOTE — Discharge Instructions (Addendum)
Your workup today was reassuring.  No concerning cause of your symptoms.  You likely have gastroenteritis.  CT scan did not show any concerning findings.  I have sent Zofran into the pharmacy for you for your nausea.  Drink plenty of fluids.  For any concerning symptoms return to the emergency department.  Take Tylenol and ibuprofen as you need to for pain control.  Tu examen de hoy fue tranquilizador. No hay ninguna causa preocupante de sus sntomas. Probablemente tengas gastroenteritis. La tomografa computarizada no mostr ningn hallazgo preocupante. He enviado a Zofran a la farmacia para sus nuseas. Beber mucho lquido. Ante cualquier sntoma preocupante, regrese al departamento de emergencias. 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

## 2023-01-22 NOTE — ED Triage Notes (Signed)
PT arrives via pov from home. Pt reports nausea, vomiting, diarrhea, and generalized body aches since Wednesday. Pt AxOx4.

## 2023-01-25 ENCOUNTER — Emergency Department (HOSPITAL_COMMUNITY)
Admission: EM | Admit: 2023-01-25 | Discharge: 2023-01-25 | Disposition: A | Discharge status: Home / Self Care | Payer: Self-pay | Attending: Emergency Medicine | Admitting: Emergency Medicine

## 2023-01-25 ENCOUNTER — Encounter (HOSPITAL_COMMUNITY): Payer: Self-pay | Admitting: Emergency Medicine

## 2023-01-25 ENCOUNTER — Other Ambulatory Visit: Payer: Self-pay

## 2023-01-25 DIAGNOSIS — R1084 Generalized abdominal pain: Secondary | ICD-10-CM | POA: Insufficient documentation

## 2023-01-25 DIAGNOSIS — R112 Nausea with vomiting, unspecified: Secondary | ICD-10-CM | POA: Insufficient documentation

## 2023-01-25 DIAGNOSIS — R197 Diarrhea, unspecified: Secondary | ICD-10-CM | POA: Insufficient documentation

## 2023-01-25 DIAGNOSIS — E876 Hypokalemia: Secondary | ICD-10-CM | POA: Insufficient documentation

## 2023-01-25 LAB — CBC WITH DIFFERENTIAL/PLATELET
Abs Immature Granulocytes: 0 10*3/uL (ref 0.00–0.07)
Basophils Absolute: 0 10*3/uL (ref 0.0–0.1)
Basophils Relative: 0 %
Eosinophils Absolute: 0 10*3/uL (ref 0.0–0.5)
Eosinophils Relative: 0 %
HCT: 44.8 % (ref 39.0–52.0)
Hemoglobin: 15.1 g/dL (ref 13.0–17.0)
Lymphocytes Relative: 26 %
Lymphs Abs: 1.5 10*3/uL (ref 0.7–4.0)
MCH: 30.7 pg (ref 26.0–34.0)
MCHC: 33.7 g/dL (ref 30.0–36.0)
MCV: 91.1 fL (ref 80.0–100.0)
Monocytes Absolute: 0.8 10*3/uL (ref 0.1–1.0)
Monocytes Relative: 14 %
Neutro Abs: 3.4 10*3/uL (ref 1.7–7.7)
Neutrophils Relative %: 60 %
Platelets: 272 10*3/uL (ref 150–400)
RBC: 4.92 MIL/uL (ref 4.22–5.81)
RDW: 13.1 % (ref 11.5–15.5)
WBC: 5.6 10*3/uL (ref 4.0–10.5)
nRBC: 0 % (ref 0.0–0.2)
nRBC: 0 /100 WBC

## 2023-01-25 LAB — COMPREHENSIVE METABOLIC PANEL
ALT: 24 U/L (ref 0–44)
AST: 24 U/L (ref 15–41)
Albumin: 3.6 g/dL (ref 3.5–5.0)
Alkaline Phosphatase: 55 U/L (ref 38–126)
Anion gap: 8 (ref 5–15)
BUN: 10 mg/dL (ref 6–20)
CO2: 22 mmol/L (ref 22–32)
Calcium: 8.6 mg/dL — ABNORMAL LOW (ref 8.9–10.3)
Chloride: 105 mmol/L (ref 98–111)
Creatinine, Ser: 0.89 mg/dL (ref 0.61–1.24)
GFR, Estimated: >60 mL/min (ref >60)
Glucose, Bld: 92 mg/dL (ref 70–99)
Potassium: 3.3 mmol/L — ABNORMAL LOW (ref 3.5–5.1)
Sodium: 135 mmol/L (ref 135–145)
Total Bilirubin: 0.8 mg/dL (ref 0.3–1.2)
Total Protein: 6.9 g/dL (ref 6.5–8.1)

## 2023-01-25 LAB — GASTROINTESTINAL PANEL BY PCR, STOOL (REPLACES STOOL CULTURE)
Adenovirus F40/41: NOT DETECTED
Campylobacter species: NOT DETECTED
Enterotoxigenic E coli (ETEC): NOT DETECTED
Norovirus GI/GII: DETECTED — AB

## 2023-01-25 LAB — C DIFFICILE QUICK SCREEN W PCR REFLEX
C Diff antigen: NOT DETECTED — AB
C Diff interpretation: NOT DETECTED
C Diff toxin: NOT DETECTED — AB

## 2023-01-25 LAB — LIPASE, BLOOD: Lipase: 39 U/L (ref 11–51)

## 2023-01-25 MED ORDER — POTASSIUM CHLORIDE CRYS ER 20 MEQ PO TBCR
40.0000 meq | EXTENDED_RELEASE_TABLET | Freq: Once | ORAL | Status: AC
  Administered 2023-01-25: 40 meq via ORAL
  Filled 2023-01-25: qty 2

## 2023-01-25 MED ORDER — PROMETHAZINE HCL 25 MG PO TABS: 25.0000 mg | ORAL_TABLET | Freq: Four times a day (QID) | ORAL | 0 refills | Status: DC | PRN

## 2023-01-25 MED ORDER — SODIUM CHLORIDE 0.9 % IV BOLUS
1000.0000 mL | Freq: Once | INTRAVENOUS | Status: AC
  Administered 2023-01-25: 1000 mL via INTRAVENOUS

## 2023-01-25 MED ORDER — ONDANSETRON HCL 4 MG/2ML IJ SOLN
4.0000 mg | Freq: Once | INTRAMUSCULAR | Status: AC
  Administered 2023-01-25: 4 mg via INTRAVENOUS
  Filled 2023-01-25: qty 2

## 2023-01-25 NOTE — ED Provider Notes (Signed)
EMERGENCY DEPARTMENT AT [facility] Provider Note   CSN: [id number] Arrival date & time: 01/25/23  0443     History  Chief Complaint  Patient presents with   Emesis   Diarrhea    Jordan Patrick is a 52 y.o. male.  52 year old male with past medical history of kidney stones and reported tuberculosis at age of 82 presents with complaint of abdominal cramping with nausea, vomiting, diarrhea.  Reports his symptoms started 4 days ago, seen at the ER at that time, continues to have multiple episodes of nonbloody nonbilious emesis and nonbloody diarrhea every day.  Reports having had 2 episodes of loose stools since arriving in the ER tonight.  Denies any recent travel, no sick contacts, no recent antibiotics.  Patient reports taking over-the-counter medications for his diarrhea without improvement in his symptoms.       Home Medications Prior to Admission medications   Medication Sig Start Date End Date Taking? Authorizing Provider  feeding supplement (ENSURE ENLIVE / ENSURE PLUS) LIQD Take 237 mLs by mouth 2 (two) times daily between meals. 08/06/21   Narda Bonds, MD  Lidocaine (HM LIDOCAINE PATCH) 4 % PTCH Apply 1 patch topically every 12 (twelve) hours as needed. 09/22/21   Haskel Schroeder, PA-C  methocarbamol (ROBAXIN) 500 MG tablet Take 1 tablet (500 mg total) by mouth every 8 (eight) hours as needed for muscle spasms. 09/22/21   Haskel Schroeder, PA-C  ondansetron (ZOFRAN-ODT) 4 MG disintegrating tablet Take 1 tablet (4 mg total) by mouth every 8 (eight) hours as needed for nausea Jordan vomiting. 01/22/23   Marita Kansas, PA-C  tamsulosin (FLOMAX) 0.4 MG CAPS capsule Take 1 capsule (0.4 mg total) by mouth daily. 08/06/21   Narda Bonds, MD      Allergies    Cipro [ciprofloxacin hcl]    Review of Systems   Review of Systems Negative except as per HPI Physical Exam Updated Vital Signs BP 118/77   Pulse 78   Temp 98.5 F (36.9 C)   Resp 19    Ht 5\' 4"  (1.626 m)   Wt 74.2 kg   SpO2 97%   BMI 28.08 kg/m  Physical Exam Vitals and nursing note reviewed.  Constitutional:      General: He is not in acute distress.    Appearance: He is well-developed. He is not diaphoretic.  HENT:     Head: Normocephalic and atraumatic.     Mouth/Throat:     Mouth: Mucous membranes are dry.  Cardiovascular:     Rate and Rhythm: Normal rate and regular rhythm.     Heart sounds: Normal heart sounds.  Pulmonary:     Effort: Pulmonary effort is normal.     Breath sounds: Normal breath sounds.  Abdominal:     Palpations: Abdomen is soft.     Tenderness: There is generalized abdominal tenderness.     Comments: Mild generalized abdominal discomfort  Skin:    General: Skin is warm and dry.     Findings: No erythema Jordan rash.  Neurological:     Mental Status: He is alert and oriented to person, place, and time.  Psychiatric:        Behavior: Behavior normal.     ED Results / Procedures / Treatments   Labs (all labs ordered are listed, but only abnormal results are displayed) Labs Reviewed  COMPREHENSIVE METABOLIC PANEL - Abnormal; Notable for the following components:      Result Value  Potassium 3.3 (*)    Calcium 8.6 (*)    All other components within normal limits  LIPASE, BLOOD  CBC WITH DIFFERENTIAL/PLATELET  URINALYSIS, ROUTINE W REFLEX MICROSCOPIC    EKG None  Radiology No results found.  Procedures Procedures    Medications Ordered in ED Medications  potassium chloride SA (KLOR-CON M) CR tablet 40 mEq (has no administration in time range)  sodium chloride 0.9 % bolus 1,000 mL (1,000 mLs Intravenous New Bag/Given 01/25/23 0541)  ondansetron (ZOFRAN) injection 4 mg (4 mg Intravenous Given 01/25/23 0550)    ED Course/ Medical Decision Making/ A&P                             Medical Decision Making Amount and/Jordan Complexity of Data Reviewed Labs: ordered.   This patient presents to the ED for concern of n/v/d,  this involves an extensive number of treatment options, and is a complaint that carries with it a high risk of complications and morbidity.  The differential diagnosis includes but not limited to gastroenteritis, colitis, infectious diarrhea    Co morbidities that complicate the patient evaluation  TB at age 51, kidney stone   Additional history obtained:  External records from outside source obtained and reviewed including prior labs on file for comparison. CT 01/22/23 without contrast showing scattered colonic stool. No bowel obstruction, free air Jordan free fluid. Normal appendix.    Lab Tests:  I Ordered, and personally interpreted labs.  The pertinent results include: Lipase within normal limits.  CMP with mild hypokalemia with potassium of 3.3.  CBC WNL.   Problem List / ED Course / Critical interventions / Medication management  52 year old male presents with complaint of nausea, vomiting diarrhea. Reports occasional abdominal cramping. Afebrile, mild abdominal discomfort on exam. 2 episodes of loose yellow stools in the lobby during brief wait. No further vomiting Jordan diarrhea while in the ER. Mild hypokalemia, provided with PO k. Plan is to PO challenge and likely dc if able to tolerate POs.  I ordered medication including zofran, IV fluids  for vomiting and hydration   Reevaluation of the patient after these medicines showed that the patient improved I have reviewed the patients home medicines and have made adjustments as needed   Social Determinants of Health:  No PCP on file   Test / Admission - Considered:  Stable for dc referred to PCP for follow up         Final Clinical Impression(s) / ED Diagnoses Final diagnoses:  Nausea vomiting and diarrhea  Hypokalemia    Rx / DC Orders ED Discharge Orders     None         Jeannie Fend, PA-C 01/25/23 1610    Tilden Fossa, MD 01/25/23 [phone]

## 2023-01-25 NOTE — ED Notes (Signed)
Pt provided stool sample in triage

## 2023-01-25 NOTE — ED Notes (Signed)
Pt given water and ginger ale.

## 2023-01-25 NOTE — Discharge Instructions (Signed)
Recheck with your primary care provider, if you do not have a primary care provider- please call [facility]. Return to the ER for worsening or concerning symptoms.

## 2023-01-25 NOTE — ED Triage Notes (Signed)
Pt returns for evaluations for NVD ongoing since Wednesday. Also reports suspected fevers, chills, and generalized abdominal pain. Was seen here, [facility], for same s/s on 5/15. Sts s/s have not improved. New onset of feeling fatigue. Has been unable to keep down liquids.  No sick exposure. No recent travel. Per note pt has had two BM since time of check in.   Pt requires spanish speaking interpreter.

## 2023-01-25 NOTE — ED Provider Notes (Signed)
Received handoff from L. Eulah Pont PA, N/V, diarrheax3 days. Repeat abdominal exam, PO challenge.   Physical Exam  BP 118/77   Pulse 78   Temp 98.5 F (36.9 C)   Resp 19   Ht 5\' 4"  (1.626 m)   Wt 74.2 kg   SpO2 97%   BMI 28.08 kg/m   Physical Exam Vitals and nursing note reviewed.  Constitutional:      General: He is not in acute distress.    Appearance: He is well-developed.  HENT:     Head: Normocephalic and atraumatic.  Eyes:     Conjunctiva/sclera: Conjunctivae normal.  Cardiovascular:     Rate and Rhythm: Normal rate and regular rhythm.     Heart sounds: No murmur heard. Pulmonary:     Effort: Pulmonary effort is normal. No respiratory distress.     Breath sounds: Normal breath sounds.  Abdominal:     Palpations: Abdomen is soft.     Tenderness: There is generalized abdominal tenderness.  Musculoskeletal:        General: No swelling.     Cervical back: Neck supple.  Skin:    General: Skin is warm and dry.     Capillary Refill: Capillary refill takes less than 2 seconds.  Neurological:     Mental Status: He is alert.  Psychiatric:        Mood and Affect: Mood normal.     Procedures  Procedures  ED Course / MDM    Medical Decision Making Amount and/or Complexity of Data Reviewed Labs: ordered.  Risk Prescription drug management.   Patient is here for N/V/Dx 3 days. Persistent. Has had 8 stools today. Stool appears watery. He has no red flag symptoms, however this is his 2nd visit for diarrhea and it has been persistent thus we will send a stool sample off to ensure no bacterial colitis that needs to be treated. His abdomen is soft and not distended. Electrolytes fairly WNL, except for mildly depleted K. He is able to tolerate PO challenge. Discharged home with promethazine, we discussed with zofran it is ODT so he should allow it to disintegrate under the tongue.    Pete Pelt, [place] 01/25/23 1610    Ernie Avena, MD 01/25/23 2028

## 2023-01-30 LAB — GASTROINTESTINAL PANEL BY PCR, STOOL (REPLACES STOOL CULTURE)
Astrovirus: NOT DETECTED
Cryptosporidium: NOT DETECTED
Cyclospora cayetanensis: NOT DETECTED
Entamoeba histolytica: NOT DETECTED
Enteroaggregative E coli (EAEC): NOT DETECTED
Enteropathogenic E coli (EPEC): NOT DETECTED
Giardia lamblia: NOT DETECTED
Plesimonas shigelloides: NOT DETECTED
Rotavirus A: DETECTED — AB
Salmonella species: NOT DETECTED
Sapovirus (I, II, IV, and V): NOT DETECTED
Shiga like toxin producing E coli (STEC): NOT DETECTED
Shigella/Enteroinvasive E coli (EIEC): NOT DETECTED
Vibrio cholerae: NOT DETECTED
Vibrio species: NOT DETECTED
Yersinia enterocolitica: NOT DETECTED

## 2023-11-23 ENCOUNTER — Encounter (HOSPITAL_COMMUNITY): Payer: Self-pay | Admitting: *Deleted

## 2023-11-23 ENCOUNTER — Other Ambulatory Visit: Payer: Self-pay

## 2023-11-23 ENCOUNTER — Emergency Department (HOSPITAL_COMMUNITY)
Admission: EM | Admit: 2023-11-23 | Discharge: 2023-11-24 | Disposition: A | Discharge status: Home / Self Care | Payer: Self-pay | Attending: Emergency Medicine | Admitting: Emergency Medicine

## 2023-11-23 DIAGNOSIS — A084 Viral intestinal infection, unspecified: Secondary | ICD-10-CM | POA: Insufficient documentation

## 2023-11-23 LAB — URINALYSIS, ROUTINE W REFLEX MICROSCOPIC
Bacteria, UA: NONE SEEN
Bilirubin Urine: NEGATIVE
Glucose, UA: NEGATIVE mg/dL
Ketones, ur: NEGATIVE mg/dL
Leukocytes,Ua: NEGATIVE
Nitrite: NEGATIVE
Protein, ur: NEGATIVE mg/dL
Specific Gravity, Urine: 1.028 (ref 1.005–1.030)
pH: 5 (ref 5.0–8.0)

## 2023-11-23 LAB — COMPREHENSIVE METABOLIC PANEL
ALT: 21 U/L (ref 0–44)
AST: 18 U/L (ref 15–41)
Albumin: 4.1 g/dL (ref 3.5–5.0)
Alkaline Phosphatase: 54 U/L (ref 38–126)
Anion gap: 9 (ref 5–15)
BUN: 14 mg/dL (ref 6–20)
CO2: 20 mmol/L — ABNORMAL LOW (ref 22–32)
Calcium: 9.1 mg/dL (ref 8.9–10.3)
Chloride: 110 mmol/L (ref 98–111)
Creatinine, Ser: 1.14 mg/dL (ref 0.61–1.24)
GFR, Estimated: >60 mL/min (ref >60)
Glucose, Bld: 127 mg/dL — ABNORMAL HIGH (ref 70–99)
Potassium: 4 mmol/L (ref 3.5–5.1)
Sodium: 139 mmol/L (ref 135–145)
Total Bilirubin: 1 mg/dL (ref 0.0–1.2)
Total Protein: 7.3 g/dL (ref 6.5–8.1)

## 2023-11-23 LAB — CBC
HCT: 46.6 % (ref 39.0–52.0)
Hemoglobin: 15.8 g/dL (ref 13.0–17.0)
MCH: 31.4 pg (ref 26.0–34.0)
MCHC: 33.9 g/dL (ref 30.0–36.0)
MCV: 92.6 fL (ref 80.0–100.0)
Platelets: 292 10*3/uL (ref 150–400)
RBC: 5.03 MIL/uL (ref 4.22–5.81)
RDW: 13.2 % (ref 11.5–15.5)
WBC: 14 10*3/uL — ABNORMAL HIGH (ref 4.0–10.5)
nRBC: 0 % (ref 0.0–0.2)

## 2023-11-23 LAB — RESP PANEL BY RT-PCR (RSV, FLU A&B, COVID)  RVPGX2
Influenza A by PCR: NEGATIVE
Influenza B by PCR: NEGATIVE
Resp Syncytial Virus by PCR: NEGATIVE
SARS Coronavirus 2 by RT PCR: NEGATIVE

## 2023-11-23 LAB — LIPASE, BLOOD: Lipase: 33 U/L (ref 11–51)

## 2023-11-23 MED ORDER — SODIUM CHLORIDE 0.9 % IV BOLUS
1000.0000 mL | Freq: Once | INTRAVENOUS | Status: AC
  Administered 2023-11-23: 1000 mL via INTRAVENOUS

## 2023-11-23 MED ORDER — DICYCLOMINE HCL 10 MG/ML IM SOLN
20.0000 mg | Freq: Once | INTRAMUSCULAR | Status: AC
  Administered 2023-11-23: 20 mg via INTRAMUSCULAR
  Filled 2023-11-23: qty 2

## 2023-11-23 MED ORDER — ONDANSETRON HCL 4 MG/2ML IJ SOLN
4.0000 mg | Freq: Once | INTRAMUSCULAR | Status: AC
  Administered 2023-11-23: 4 mg via INTRAVENOUS
  Filled 2023-11-23: qty 2

## 2023-11-23 NOTE — ED Triage Notes (Signed)
 The pt woke up this am with abd pain and diarrhea nausea and vomiting with a low temp

## 2023-11-23 NOTE — ED Provider Notes (Signed)
 [place] EMERGENCY DEPARTMENT AT [facility] Provider Note   CSN: [id number] Arrival date & time: 11/23/23  1507     History {Add pertinent medical, surgical, social history, OB history to HPI:1} Chief Complaint  Patient presents with  . Abdominal Pain    Jordan Patrick is a 53 y.o. male.  53 year old male with a history of tuberculosis presents to the emergency department for evaluation of nausea, vomiting, diarrhea.  Patient states that symptoms began at 7 AM today and have been persistent.  His emesis has been yellow in color.  Diarrhea has been watery.  Denies any blood in his emesis or bowel movements.  His last episode of each was approximately 1 hour ago.  Has generalized abdominal discomfort associated with persistent vomiting.  He has not taken any medications for his symptoms.  Denies any known sick contacts.  No history of abdominal surgeries.  The history is provided by the patient. No language interpreter was used.  Abdominal Pain      Home Medications Prior to Admission medications   Medication Sig Start Date End Date Taking? Authorizing Provider  feeding supplement (ENSURE ENLIVE / ENSURE PLUS) LIQD Take 237 mLs by mouth 2 (two) times daily between meals. 08/06/21   Narda Bonds, MD  Lidocaine (HM LIDOCAINE PATCH) 4 % PTCH Apply 1 patch topically every 12 (twelve) hours as needed. 09/22/21   Haskel Schroeder, PA-C  methocarbamol (ROBAXIN) 500 MG tablet Take 1 tablet (500 mg total) by mouth every 8 (eight) hours as needed for muscle spasms. 09/22/21   Haskel Schroeder, PA-C  ondansetron (ZOFRAN-ODT) 4 MG disintegrating tablet Take 1 tablet (4 mg total) by mouth every 8 (eight) hours as needed for nausea or vomiting. 01/22/23   Marita Kansas, PA-C  promethazine (PHENERGAN) 25 MG tablet Take 1 tablet (25 mg total) by mouth every 6 (six) hours as needed for nausea or vomiting. 01/25/23   Small, Brooke L, PA  tamsulosin (FLOMAX) 0.4 MG CAPS capsule  Take 1 capsule (0.4 mg total) by mouth daily. 08/06/21   Narda Bonds, MD      Allergies    Cipro [ciprofloxacin hcl]    Review of Systems   Review of Systems  Gastrointestinal:  Positive for abdominal pain.  Ten systems reviewed and are negative for acute change, except as noted in the HPI.    Physical Exam Updated Vital Signs BP 117/86 (BP Location: Right Arm)   Pulse 99   Temp 98.9 F (37.2 C)   Resp 17   Ht 5\' 5"  (1.651 m)   Wt 74.2 kg   SpO2 100%   BMI 27.22 kg/m   Physical Exam Vitals and nursing note reviewed.  Constitutional:      General: He is not in acute distress.    Appearance: He is well-developed. He is not diaphoretic.     Comments: Nontoxic appearing and in NAD  HENT:     Head: Normocephalic and atraumatic.  Eyes:     General: No scleral icterus.    Conjunctiva/sclera: Conjunctivae normal.  Pulmonary:     Effort: Pulmonary effort is normal. No respiratory distress.  Abdominal:     Comments: Abdomen soft, nondistended. No focal TTP on exam. No palpable masses or peritoneal signs.  Musculoskeletal:        General: Normal range of motion.     Cervical back: Normal range of motion.  Skin:    General: Skin is warm and dry.  Coloration: Skin is not pale.     Findings: No erythema or rash.  Neurological:     Mental Status: He is alert and oriented to person, place, and time.     Coordination: Coordination normal.  Psychiatric:        Behavior: Behavior normal.     ED Results / Procedures / Treatments   Labs (all labs ordered are listed, but only abnormal results are displayed) Labs Reviewed  COMPREHENSIVE METABOLIC PANEL - Abnormal; Notable for the following components:      Result Value   CO2 20 (*)    Glucose, Bld 127 (*)    All other components within normal limits  CBC - Abnormal; Notable for the following components:   WBC 14.0 (*)    All other components within normal limits  URINALYSIS, ROUTINE W REFLEX MICROSCOPIC - Abnormal;  Notable for the following components:   Color, Urine AMBER (*)    Hgb urine dipstick SMALL (*)    All other components within normal limits  RESP PANEL BY RT-PCR (RSV, FLU A&B, COVID)  RVPGX2  LIPASE, BLOOD    EKG None  Radiology No results found.  Procedures Procedures  {Document cardiac monitor, telemetry assessment procedure when appropriate:1}  Medications Ordered in ED Medications  ondansetron (ZOFRAN) injection 4 mg (has no administration in time range)  dicyclomine (BENTYL) injection 20 mg (has no administration in time range)  sodium chloride 0.9 % bolus 1,000 mL (has no administration in time range)    ED Course/ Medical Decision Making/ A&P Clinical Course as of 11/23/23 2242  Sun Nov 23, 2023  2242 Symptoms consistent with gastroenteritis.  Suspect that his leukocytosis is secondary to the stress of ongoing vomiting, dehydration.  Will give IV fluids as well as antiemetics, Bentyl.  Patient is overall clinically well-appearing.  He has a soft, nontender abdomen.  Given benign exam we will hold imaging pending symptom control, reassessment. [KH]    Clinical Course User Index [KH] Antony Madura, PA-C   {   Click here for ABCD2, HEART and other calculatorsREFRESH Note before signing :1}                              Medical Decision Making Risk Prescription drug management.   ***  {Document critical care time when appropriate:1} {Document review of labs and clinical decision tools ie heart score, Chads2Vasc2 etc:1}  {Document your independent review of radiology images, and any outside records:1} {Document your discussion with family members, caretakers, and with consultants:1} {Document social determinants of health affecting pt's care:1} {Document your decision making why or why not admission, treatments were needed:1} Final Clinical Impression(s) / ED Diagnoses Final diagnoses:  None    Rx / DC Orders ED Discharge Orders     None

## 2023-11-24 ENCOUNTER — Emergency Department (HOSPITAL_COMMUNITY): Payer: Self-pay

## 2023-11-24 MED ORDER — METOCLOPRAMIDE HCL 5 MG/ML IJ SOLN
10.0000 mg | INTRAMUSCULAR | Status: AC
  Administered 2023-11-24: 10 mg via INTRAVENOUS
  Filled 2023-11-24: qty 2

## 2023-11-24 MED ORDER — FAMOTIDINE IN NACL 20-0.9 MG/50ML-% IV SOLN
20.0000 mg | Freq: Once | INTRAVENOUS | Status: AC
  Administered 2023-11-24: 20 mg via INTRAVENOUS
  Filled 2023-11-24: qty 50

## 2023-11-24 MED ORDER — IOHEXOL 350 MG/ML SOLN
75.0000 mL | Freq: Once | INTRAVENOUS | Status: AC | PRN
  Administered 2023-11-24: 75 mL via INTRAVENOUS

## 2023-11-24 MED ORDER — ACETAMINOPHEN 325 MG PO TABS
650.0000 mg | ORAL_TABLET | Freq: Once | ORAL | Status: AC
  Administered 2023-11-24: 650 mg via ORAL
  Filled 2023-11-24: qty 2

## 2023-11-24 MED ORDER — PROMETHAZINE HCL 25 MG PO TABS: 25.0000 mg | ORAL_TABLET | Freq: Four times a day (QID) | ORAL | 0 refills | Status: AC | PRN

## 2023-11-24 MED ORDER — KETOROLAC TROMETHAMINE 15 MG/ML IJ SOLN
15.0000 mg | Freq: Once | INTRAMUSCULAR | Status: AC
  Administered 2023-11-24: 15 mg via INTRAVENOUS
  Filled 2023-11-24: qty 1

## 2023-11-24 MED ORDER — SODIUM CHLORIDE 0.9 % IV BOLUS
500.0000 mL | Freq: Once | INTRAVENOUS | Status: AC
  Administered 2023-11-24: 500 mL via INTRAVENOUS

## 2023-11-24 NOTE — Discharge Instructions (Addendum)
 Evite las frituras, las comidas grasosas y los productos lcteos hasta que los sntomas remitan. Beba abundantes lquidos claros para prevenir la deshidratacin. Recomendamos el uso de Phenergan segn lo prescrito para las nuseas y los vmitos. Consulte con un mdico de cabecera para asegurar la resolucin de los sntomas.  Avoid fried foods, fatty foods, greasy foods, and milk products until symptoms resolve. Drink plenty of clear liquids to prevent dehydration. We recommend the use of Phenergan as prescribed for nausea/vomiting. Follow-up with a primary care doctor to ensure resolution of symptoms.

## 2024-10-07 ENCOUNTER — Other Ambulatory Visit: Payer: Self-pay

## 2024-10-07 ENCOUNTER — Emergency Department (HOSPITAL_COMMUNITY)
Admission: EM | Admit: 2024-10-07 | Discharge: 2024-10-08 | Disposition: A | Discharge status: Home / Self Care | Payer: Self-pay | Attending: Emergency Medicine | Admitting: Emergency Medicine

## 2024-10-07 ENCOUNTER — Emergency Department (HOSPITAL_COMMUNITY): Payer: Self-pay

## 2024-10-07 ENCOUNTER — Encounter (HOSPITAL_COMMUNITY): Payer: Self-pay

## 2024-10-07 DIAGNOSIS — R0789 Other chest pain: Secondary | ICD-10-CM | POA: Insufficient documentation

## 2024-10-07 DIAGNOSIS — R079 Chest pain, unspecified: Secondary | ICD-10-CM

## 2024-10-07 DIAGNOSIS — R202 Paresthesia of skin: Secondary | ICD-10-CM | POA: Insufficient documentation

## 2024-10-07 LAB — TROPONIN T, HIGH SENSITIVITY
Troponin T High Sensitivity: <6 ng/L (ref 0–19)
Troponin T High Sensitivity: 6 ng/L (ref 0–19)

## 2024-10-07 LAB — CBC
HCT: 45.2 % (ref 39.0–52.0)
Hemoglobin: 15.3 g/dL (ref 13.0–17.0)
MCH: 31.1 pg (ref 26.0–34.0)
MCHC: 33.8 g/dL (ref 30.0–36.0)
MCV: 91.9 fL (ref 80.0–100.0)
Platelets: 280 10*3/uL (ref 150–400)
RBC: 4.92 MIL/uL (ref 4.22–5.81)
RDW: 13.1 % (ref 11.5–15.5)
WBC: 8 10*3/uL (ref 4.0–10.5)
nRBC: 0 % (ref 0.0–0.2)

## 2024-10-07 LAB — BASIC METABOLIC PANEL WITH GFR
Anion gap: 12 (ref 5–15)
BUN: 11 mg/dL (ref 6–20)
CO2: 24 mmol/L (ref 22–32)
Calcium: 9.3 mg/dL (ref 8.9–10.3)
Chloride: 101 mmol/L (ref 98–111)
Creatinine, Ser: 0.82 mg/dL (ref 0.61–1.24)
GFR, Estimated: >60 mL/min
Glucose, Bld: 94 mg/dL (ref 70–99)
Potassium: 4.4 mmol/L (ref 3.5–5.1)
Sodium: 137 mmol/L (ref 135–145)

## 2024-10-07 NOTE — ED Provider Notes (Signed)
 " [facility] DE[facility] Emergency Department Provider Note MRN:  [id number]  Arrival date & time: 10/07/24     Chief Complaint   Chest Pain   History of Present Illness   Jordan Patrick is a 54 y.o. year-old male with no pertinent past medical history presenting to the ED with chief complaint of chest pain.  Intermittent left-sided chest pain over the past 3 days.  Had an episode today that was even worse than the past 2 days, here for evaluation.  Associated with paresthesia to the left arm.  Pain is nonradiating.  Denies dizziness or sweatiness, no nausea or vomiting, no trouble breathing.  No leg pain or swelling.  Not currently in pain.  Review of Systems  A thorough review of systems was obtained and all systems are negative except as noted in the HPI and PMH.   Patient's Health History    Past Medical History:  Diagnosis Date   Hydronephrosis, right    Tuberculosis    when he was 54 years old he was treated for TB and was in the hospital for 4 months   Urinary pain     Past Surgical History:  Procedure Laterality Date   CYSTOSCOPY WITH RETROGRADE PYELOGRAM, URETEROSCOPY AND STENT PLACEMENT Right 06/18/2021   Procedure: CYSTOSCOPY WITH RETROGRADE PYELOGRAM, URETEROSCOPY AND STENT PLACEMENT;  Surgeon: Elisabeth Valli BIRCH, MD;  Location: [facility] OR;  Service: Urology;  Laterality: Right;   CYSTOSCOPY WITH RETROGRADE PYELOGRAM, URETEROSCOPY AND STENT PLACEMENT Right 07/10/2021   Procedure: CYSTOSCOPY WITH RETROGRADE PYELOGRAM, DIAGNOSTIC URETEROSCOPY, BALLOON DILATION AND STENT EXCHANGE;  Surgeon: Elisabeth Valli BIRCH, MD;  Location: [facility];  Service: Urology;  Laterality: Right;   CYSTOSCOPY WITH RETROGRADE PYELOGRAM, URETEROSCOPY AND STENT PLACEMENT Right 08/03/2021   Procedure: CYSTOSCOPY WITH RETROGRADE PYELOGRAM, DIAGNOSTIC URETEROSCOPY AND STENT EXCHANGE;  Surgeon: Elisabeth Valli BIRCH, MD;  Location: [facility] OR[facility];  Service: Urology;  Laterality: Right;    I & D EXTREMITY  05/21/2012   Procedure: IRRIGATION AND DEBRIDEMENT EXTREMITY;  Surgeon: Elsie Mussel, MD;  Location: [facility] OR;  Service: Orthopedics;  Laterality: Right;  with tendon repair    History reviewed. No pertinent family history.  Social History   Socioeconomic History   Marital status: Single    Spouse name: Not on file   Number of children: Not on file   Years of education: Not on file   Highest education level: Not on file  Occupational History   Not on file  Tobacco Use   Smoking status: Never   Smokeless tobacco: Never  Substance and Sexual Activity   Alcohol use: Not Currently   Drug use: No   Sexual activity: Not on file  Other Topics Concern   Not on file  Social History Narrative   Not on file   Social Drivers of Health   Tobacco Use: Low Risk (10/07/2024)   Patient History    Smoking Tobacco Use: Never    Smokeless Tobacco Use: Never    Passive Exposure: Not on file  Financial Resource Strain: Not on file  Food Insecurity: Not on file  Transportation Needs: Not on file  Physical Activity: Not on file  Stress: Not on file  Social Connections: Unknown (01/22/2022)   Received from [facility]   Social Network    Social Network: Not on file  Intimate Partner Violence: Unknown (12/14/2021)   Received from [facility]   HITS    Physically Hurt: Not on file    Insult or  Talk Down To: Not on file    Threaten Physical Harm: Not on file    Scream or Curse: Not on file  Depression (PHQ2-9): Not on file  Alcohol Screen: Not on file  Housing: Not on file  Utilities: Not on file  Health Literacy: Not on file     Physical Exam   Vitals:   10/07/24 2313 10/07/24 2327  BP: 121/79   Pulse: (!) 58   Resp: 18   Temp: 97.6 F (36.4 C)   SpO2: 100% 100%    CONSTITUTIONAL: Well-appearing, NAD NEURO/PSYCH:  Alert and oriented x 3, no focal deficits EYES:  eyes equal and reactive ENT/NECK:  no LAD, no JVD CARDIO: Regular rate, well-perfused, normal S1  and S2 PULM:  CTAB no wheezing or rhonchi GI/GU:  non-distended, non-tender MSK/SPINE:  No gross deformities, no edema SKIN:  no rash, atraumatic   *Additional and/or pertinent findings included in MDM below  Diagnostic and Interventional Summary    EKG Interpretation Date/Time:  Thursday October 07 2024 16:06:54 EST Ventricular Rate:  66 PR Interval:  162 QRS Duration:  98 QT Interval:  394 QTC Calculation: 413 R Axis:   74  Text Interpretation: Normal sinus rhythm Minimal voltage criteria for LVH, may be normal variant ( Sokolow-Lyon ) Borderline ECG When compared with ECG of 04-Aug-2021 13:45, PREVIOUS ECG IS PRESENT Confirmed by Theadore Sharper [phone]) on 10/07/2024 11:20:51 PM       Labs Reviewed  BASIC METABOLIC PANEL WITH GFR  CBC  TROPONIN T, HIGH SENSITIVITY  TROPONIN T, HIGH SENSITIVITY    DG Chest 2 View  Final Result      Medications - No data to display   Procedures  /  Critical Care Procedures  ED Course and Medical Decision Making  Initial Impression and Ddx ACS is considered.  Patient has little to no cardiovascular risk factors.  Denies history of hypertension or diabetes, does not smoke.  No prior cardiac issues.  Denies an exertional nature, denies a pleuritic nature.  No evidence of DVT, no hypoxia, no tachycardia, highly doubt PE, highly doubt dissection.  Not currently in pain, sitting comfortably with normal vital signs.  Past medical/surgical history that increases complexity of ED encounter: None  Interpretation of Diagnostics I personally reviewed the EKG and my interpretation is as follows: Sinus rhythm without concerning ischemic finding  No significant blood count or electrolyte disturbance.  Troponin negative x 2  Patient Reassessment and Ultimate Disposition/Management     With low heart score and reassuring workup patient is appropriate for discharge with cardiology follow-up.  Patient management required discussion with the  following services or consulting groups:  None  Complexity of Problems Addressed Acute illness or injury that poses threat of life of bodily function  Additional Data Reviewed and Analyzed Further history obtained from: Further history from spouse/family member  Additional Factors Impacting ED Encounter Risk Consideration of hospitalization  Sharper HERO. Theadore, MD [facility] Emergency Medicine [facility] [email or web address]  Final Clinical Impressions(s) / ED Diagnoses     ICD-10-CM   1. Chest pain, unspecified type  R07.9 Ambulatory referral to Cardiology      ED Discharge Orders          Ordered    Ambulatory referral to Cardiology        10/07/24 2350             Discharge Instructions Discussed with and Provided to Patient:    Discharge Instructions  You were evaluated in the Emergency Department and after careful evaluation, we did not find any emergent condition requiring admission or further testing in the hospital.  Your exam/testing today is overall reassuring.  Recommend follow-up with cardiology for consideration of further testing as we discussed.  Please return to the Emergency Department if you experience any worsening of your condition.   Thank you for allowing us  to be a part of your care.      Theadore Ozell HERO, MD 10/07/24 [phone]  "

## 2024-10-07 NOTE — ED Triage Notes (Signed)
 Pt states he is having chest pain and left arm numbness ongoing for 3 days but worse today. Pt denies hx of a heart attack but says it runs in his family. Mobile translator used to gather this information

## 2024-10-07 NOTE — Discharge Instructions (Addendum)
 You were evaluated in the Emergency Department and after careful evaluation, we did not find any emergent condition requiring admission or further testing in the hospital.  Your exam/testing today is overall reassuring.  Recommend follow-up with cardiology for consideration of further testing as we discussed.  Please return to the Emergency Department if you experience any worsening of your condition.   Thank you for allowing us  to be a part of your care.

## 2024-10-21 ENCOUNTER — Ambulatory Visit: Admitting: Nurse Practitioner
